# Patient Record
Sex: Male | Born: 1955 | Race: White | Hispanic: No | Smoking: Former smoker
Health system: Southern US, Community
[De-identification: ages and names within clinical notes are randomized; demographics above are authoritative.]

## PROBLEM LIST (undated history)

## (undated) DIAGNOSIS — F32A Depression, unspecified: Secondary | ICD-10-CM

## (undated) DIAGNOSIS — F429 Obsessive-compulsive disorder, unspecified: Secondary | ICD-10-CM

## (undated) DIAGNOSIS — Z5900 Homelessness unspecified: Secondary | ICD-10-CM

## (undated) DIAGNOSIS — F319 Bipolar disorder, unspecified: Secondary | ICD-10-CM

## (undated) DIAGNOSIS — R443 Hallucinations, unspecified: Secondary | ICD-10-CM

## (undated) DIAGNOSIS — M549 Dorsalgia, unspecified: Secondary | ICD-10-CM

## (undated) DIAGNOSIS — G8929 Other chronic pain: Secondary | ICD-10-CM

## (undated) DIAGNOSIS — F22 Delusional disorders: Secondary | ICD-10-CM

## (undated) DIAGNOSIS — Z9114 Patient's other noncompliance with medication regimen: Secondary | ICD-10-CM

## (undated) DIAGNOSIS — Z91148 Patient's other noncompliance with medication regimen for other reason: Secondary | ICD-10-CM

## (undated) DIAGNOSIS — K219 Gastro-esophageal reflux disease without esophagitis: Secondary | ICD-10-CM

## (undated) DIAGNOSIS — F1010 Alcohol abuse, uncomplicated: Secondary | ICD-10-CM

## (undated) DIAGNOSIS — F329 Major depressive disorder, single episode, unspecified: Secondary | ICD-10-CM

## (undated) HISTORY — DX: Depression, unspecified: F32.A

## (undated) HISTORY — PX: FRACTURE SURGERY: SHX138

## (undated) HISTORY — PX: NECK SURGERY: SHX720

---
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
## 1976-05-12 HISTORY — PX: FRACTURE SURGERY: SHX138

## 1998-05-12 HISTORY — PX: LEG SURGERY: SHX1003

## 2003-09-16 ENCOUNTER — Emergency Department (HOSPITAL_COMMUNITY): Admission: EM | Admit: 2003-09-16 | Discharge: 2003-09-16 | Payer: Self-pay | Admitting: Emergency Medicine

## 2005-04-12 ENCOUNTER — Emergency Department (HOSPITAL_COMMUNITY): Admission: EM | Admit: 2005-04-12 | Discharge: 2005-04-12 | Payer: Self-pay | Admitting: Emergency Medicine

## 2005-04-25 ENCOUNTER — Emergency Department (HOSPITAL_COMMUNITY): Admission: EM | Admit: 2005-04-25 | Discharge: 2005-04-25 | Payer: Self-pay | Admitting: Emergency Medicine

## 2010-09-13 ENCOUNTER — Emergency Department (HOSPITAL_COMMUNITY)
Admission: EM | Admit: 2010-09-13 | Discharge: 2010-09-13 | Disposition: A | Discharge status: Home / Self Care | Payer: Self-pay | Attending: Emergency Medicine | Admitting: Emergency Medicine

## 2010-09-13 DIAGNOSIS — W57XXXA Bitten or stung by nonvenomous insect and other nonvenomous arthropods, initial encounter: Secondary | ICD-10-CM | POA: Insufficient documentation

## 2010-09-13 DIAGNOSIS — S30860A Insect bite (nonvenomous) of lower back and pelvis, initial encounter: Secondary | ICD-10-CM | POA: Insufficient documentation

## 2013-02-21 ENCOUNTER — Emergency Department (HOSPITAL_COMMUNITY)
Admission: EM | Admit: 2013-02-21 | Discharge: 2013-02-21 | Disposition: A | Discharge status: Home / Self Care | Payer: Self-pay | Attending: Emergency Medicine | Admitting: Emergency Medicine

## 2013-02-21 ENCOUNTER — Encounter (HOSPITAL_COMMUNITY): Payer: Self-pay | Admitting: Emergency Medicine

## 2013-02-21 DIAGNOSIS — Z79899 Other long term (current) drug therapy: Secondary | ICD-10-CM | POA: Insufficient documentation

## 2013-02-21 DIAGNOSIS — F419 Anxiety disorder, unspecified: Secondary | ICD-10-CM

## 2013-02-21 DIAGNOSIS — F411 Generalized anxiety disorder: Secondary | ICD-10-CM | POA: Insufficient documentation

## 2013-02-21 MED ORDER — ALPRAZOLAM 0.5 MG PO TABS: 0.5000 mg | ORAL_TABLET | Freq: Three times a day (TID) | ORAL | Status: DC | PRN
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
## 2013-02-21 NOTE — Progress Notes (Deleted)
ED/CM noted patient did not have health insurance and/or PCP listed in the computer.  Patient was given the [place] resource handout with information on the clinics, food pantries, and the handout for new health insurance sign-up.  Patient expressed appreciation for this. 
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
## 2013-02-21 NOTE — ED Notes (Signed)
Explained to patient delay in care.

## 2013-03-02 ENCOUNTER — Encounter (HOSPITAL_COMMUNITY): Payer: Self-pay | Admitting: Emergency Medicine

## 2013-03-02 ENCOUNTER — Emergency Department (HOSPITAL_COMMUNITY)
Admission: EM | Admit: 2013-03-02 | Discharge: 2013-03-02 | Disposition: A | Discharge status: Other Acute Inpatient Hospital | Payer: Self-pay | Attending: Emergency Medicine | Admitting: Emergency Medicine

## 2013-03-02 ENCOUNTER — Inpatient Hospital Stay (HOSPITAL_COMMUNITY)
Admission: AD | Admit: 2013-03-02 | Discharge: 2013-03-10 | DRG: 897 | Disposition: A | Discharge status: Home / Self Care | Payer: Federal, State, Local not specified - Other | Source: Intra-hospital | Attending: Psychiatry | Admitting: Psychiatry

## 2013-03-02 DIAGNOSIS — F319 Bipolar disorder, unspecified: Secondary | ICD-10-CM | POA: Diagnosis present

## 2013-03-02 DIAGNOSIS — R4589 Other symptoms and signs involving emotional state: Secondary | ICD-10-CM

## 2013-03-02 DIAGNOSIS — F3289 Other specified depressive episodes: Secondary | ICD-10-CM | POA: Diagnosis present

## 2013-03-02 DIAGNOSIS — F10939 Alcohol use, unspecified with withdrawal, unspecified: Principal | ICD-10-CM | POA: Diagnosis present

## 2013-03-02 DIAGNOSIS — Z79899 Other long term (current) drug therapy: Secondary | ICD-10-CM

## 2013-03-02 DIAGNOSIS — F10239 Alcohol dependence with withdrawal, unspecified: Principal | ICD-10-CM | POA: Diagnosis present

## 2013-03-02 DIAGNOSIS — F1020 Alcohol dependence, uncomplicated: Secondary | ICD-10-CM | POA: Diagnosis present

## 2013-03-02 DIAGNOSIS — R4182 Altered mental status, unspecified: Secondary | ICD-10-CM | POA: Insufficient documentation

## 2013-03-02 DIAGNOSIS — F39 Unspecified mood [affective] disorder: Secondary | ICD-10-CM | POA: Insufficient documentation

## 2013-03-02 DIAGNOSIS — R45851 Suicidal ideations: Secondary | ICD-10-CM | POA: Insufficient documentation

## 2013-03-02 DIAGNOSIS — F329 Major depressive disorder, single episode, unspecified: Secondary | ICD-10-CM | POA: Diagnosis present

## 2013-03-02 LAB — CBC WITH DIFFERENTIAL/PLATELET
Basophils Absolute: 0 10*3/uL (ref 0.0–0.1)
Basophils Relative: 0 % (ref 0–1)
Eosinophils Absolute: 0.1 10*3/uL (ref 0.0–0.7)
Eosinophils Relative: 1 % (ref 0–5)
HCT: 40.4 % (ref 39.0–52.0)
Hemoglobin: 14.3 g/dL (ref 13.0–17.0)
Lymphocytes Relative: 17 % (ref 12–46)
Lymphs Abs: 1 10*3/uL (ref 0.7–4.0)
MCH: 32.6 pg (ref 26.0–34.0)
MCHC: 35.4 g/dL (ref 30.0–36.0)
MCV: 92 fL (ref 78.0–100.0)
Monocytes Absolute: 0.8 10*3/uL (ref 0.1–1.0)
Monocytes Relative: 13 % — ABNORMAL HIGH (ref 3–12)
Neutro Abs: 4.1 10*3/uL (ref 1.7–7.7)
Neutrophils Relative %: 69 % (ref 43–77)
Platelets: 296 10*3/uL (ref 150–400)
RBC: 4.39 MIL/uL (ref 4.22–5.81)
RDW: 13.4 % (ref 11.5–15.5)
WBC: 6 10*3/uL (ref 4.0–10.5)

## 2013-03-02 LAB — BASIC METABOLIC PANEL
BUN: 15 mg/dL (ref 6–23)
CO2: 30 mEq/L (ref 19–32)
Calcium: 9.6 mg/dL (ref 8.4–10.5)
Chloride: 91 mEq/L — ABNORMAL LOW (ref 96–112)
Creatinine, Ser: 0.83 mg/dL (ref 0.50–1.35)
GFR calc Af Amer: >90 mL/min (ref >90)
GFR calc non Af Amer: >90 mL/min (ref >90)
Glucose, Bld: 103 mg/dL — ABNORMAL HIGH (ref 70–99)
Potassium: 4.4 mEq/L (ref 3.5–5.1)
Sodium: 131 mEq/L — ABNORMAL LOW (ref 135–145)

## 2013-03-02 LAB — RAPID URINE DRUG SCREEN, HOSP PERFORMED
Amphetamines: NOT DETECTED
Barbiturates: NOT DETECTED
Benzodiazepines: NOT DETECTED
Cocaine: NOT DETECTED
Opiates: NOT DETECTED
Tetrahydrocannabinol: NOT DETECTED

## 2013-03-02 LAB — ETHANOL: Alcohol, Ethyl (B): <11 mg/dL (ref 0–11)

## 2013-03-02 MED ORDER — LORAZEPAM 1 MG PO TABS
1.0000 mg | ORAL_TABLET | Freq: Once | ORAL | Status: AC
  Administered 2013-03-02: 1 mg via ORAL
  Filled 2013-03-02: qty 1

## 2013-03-02 MED ORDER — LORAZEPAM 1 MG PO TABS: 1.0000 mg | ORAL_TABLET | ORAL | Status: DC | PRN

## 2013-03-02 NOTE — BH Assessment (Addendum)
After tele assessment completion writer spoke with both [facility], Thurman Coyer and PA, Donell Sievert, about patient who presents with SI, no HI, and request for detox. Patient reports no history of seizures yet blackouts have increased in number and frequency over the last 2-3 months. He reports last drink was 03/01/13 with 40 year history of alcohol uss. Patient reports he did experience 375 days of abstinence while incarcerated for felony DUI in 2008.  Writer spoke with patient's physician, Dr Estell Harpin, and patient's nurse Lurena Joiner, to inform them patient is accepted for detox to room 303/1. Patient's RN will call report to adult unit and arrange transportation via sheriff as patient is IVC.  Carney Bern, LCSW

## 2013-03-02 NOTE — ED Notes (Signed)
Pt is IVC, pt states he has been drinking in excess with blackouts, feels he is withdrawing from ETOH, pt states he denies SI/HI but petition from IVC states he commented to his brother he was depressed and didn't want to live.

## 2013-03-02 NOTE — ED Notes (Signed)
Spoke with BH.  They have about 3 ahead of Pt for consult, but will call about 10 minutes ahead of the time for his consult.

## 2013-03-02 NOTE — ED Notes (Signed)
States he has been feeling suicidal for the past couple of months, states he really does not have a plan but would probably take some pills, because they are available

## 2013-03-02 NOTE — ED Provider Notes (Signed)
CSN: [id number]     Arrival date & time 03/02/13  1521 History   First MD Initiated Contact with Patient 03/02/13 1615     Chief Complaint  Patient presents with  . V70.1   (Consider location/radiation/quality/duration/timing/severity/associated sxs/prior Treatment) Patient is a 57 y.o. male presenting with altered mental status. The history is provided by the police (pt is commited for alcohol abuse and suicidal).  Altered Mental Status Presenting symptoms: behavior changes   Severity:  Severe Most recent episode:  More than 2 days ago Episode history:  Multiple Timing:  Constant Progression:  Worsening Associated symptoms: no abdominal pain, no headaches, no rash and no seizures     History reviewed. No pertinent past medical history. Past Surgical History  Procedure Laterality Date  . Neck surgery     History reviewed. No pertinent family history. History  Substance Use Topics  . Smoking status: Never Smoker   . Smokeless tobacco: Not on file  . Alcohol Use: Yes     Comment: daily    Review of Systems  Constitutional: Negative for appetite change and fatigue.  HENT: Negative for congestion, ear discharge and sinus pressure.   Eyes: Negative for discharge.  Respiratory: Negative for cough.   Cardiovascular: Negative for chest pain.  Gastrointestinal: Negative for abdominal pain and diarrhea.  Genitourinary: Negative for frequency and hematuria.  Musculoskeletal: Negative for back pain.  Skin: Negative for rash.  Neurological: Negative for seizures and headaches.  Psychiatric/Behavioral: Positive for suicidal ideas and dysphoric mood.    Allergies  Review of patient's allergies indicates no known allergies.  Home Medications  No current outpatient prescriptions on file. BP 131/99  Pulse 82  Temp(Src) 98.3 F (36.8 C) (Oral)  Resp 20  Ht 5\' 8"  (1.727 m)  Wt 135 lb (61.236 kg)  BMI 20.53 kg/m2  SpO2 98% Physical Exam  Constitutional: He is oriented to  person, place, and time. He appears well-developed.  HENT:  Head: Normocephalic.  Eyes: Conjunctivae and EOM are normal. No scleral icterus.  Neck: Neck supple. No thyromegaly present.  Cardiovascular: Normal rate and regular rhythm.  Exam reveals no gallop and no friction rub.   No murmur heard. Pulmonary/Chest: No stridor. He has no wheezes. He has no rales. He exhibits no tenderness.  Abdominal: He exhibits no distension. There is no tenderness. There is no rebound.  Musculoskeletal: Normal range of motion. He exhibits no edema.  Lymphadenopathy:    He has no cervical adenopathy.  Neurological: He is oriented to person, place, and time. He exhibits normal muscle tone. Coordination normal.  Skin: No rash noted. No erythema.  Psychiatric:  Pt is depressed.  Denies suicidal now.  Was suicidal early today    ED Course  Procedures (including critical care time) Labs Review Labs Reviewed  CBC WITH DIFFERENTIAL - Abnormal; Notable for the following:    Monocytes Relative 13 (*)    All other components within normal limits  BASIC METABOLIC PANEL - Abnormal; Notable for the following:    Sodium 131 (*)    Chloride 91 (*)    Glucose, Bld 103 (*)    All other components within normal limits  ETHANOL  URINE RAPID DRUG SCREEN (HOSP PERFORMED)   Imaging Review No results found.  EKG Interpretation   None       MDM  No diagnosis found.     Benny Lennert, MD 03/02/13 [phone]

## 2013-03-02 NOTE — ED Notes (Signed)
Security returned belonging to pt. Clothes in bag, given to pt. Officer leaving with pt. Report called, paper work complete and copied

## 2013-03-02 NOTE — ED Notes (Signed)
telepsych in process. Officer at the door.

## 2013-03-02 NOTE — ED Notes (Signed)
Feels like something is crawling over his body

## 2013-03-02 NOTE — BH Assessment (Signed)
Assessment Note  [name] is an 57 y.o. male.   Patient presented to [facility] ED today with brother who has since taken IVC papers on patient due to patient's reported suicidal ideation. Patient has plan to overdose on ibuprofen or hydro condone along with alcohol.  Writer spoke with patient via tele assessment and patient in agreement of increasing suicidal thoughts over last few days/week as feelings of hopelessness and despair increased in relation to increased alcohol use and increasing financial concerns. Patient reports he lives alone and that home is secure yet he has had increased financial stress concerning rental property due to non payment of rent and that property is now in foreclosure. Patient reports increased stress with family in relation to their concern about his daily alcohol use. 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    Patient reports no history of seizures yet blackouts have increased in number and frequency over the last 2-3 months. He reports last drink was 03/01/13 with 40 year history of alcohol use. Patient reports he did experience 375 days of abstinence while incarcerated for felony DUI in 2008.  Writer spoke with patient's physician, Dr Estell Harpin, and patient's nurse Lurena Joiner, to inform them patient is accepted for detox to room 303/1. Patient's RN will call report to adult unit and arrange transportation via sheriff as patient is IVC.   Axis I: Alcohol Abuse and Suicidal Ideation Axis II: Deferred Axis III: History reviewed. No pertinent past medical history. Axis IV: economic problems, other psychosocial or environmental problems, problems related to social environment, problems with access to health care services and problems with primary support group Axis V: 11-20 some danger of hurting self or others possible OR  occasionally fails to maintain minimal personal hygiene OR gross impairment in communication  Past Medical History: History reviewed. No pertinent past medical history.  Past Surgical History  Procedure Laterality Date  . Neck surgery      Family History: History reviewed. No pertinent family history.  Social History:  reports that he has never smoked. He does not have any smokeless tobacco history on file. He reports that he drinks alcohol. He reports that he does not use illicit drugs.  Additional Social History:     CIWA: CIWA-Ar BP: 123/63 mmHg Pulse Rate: 69 Nausea and Vomiting: no nausea and no vomiting Tactile Disturbances: moderately severe hallucinations Tremor: no tremor Auditory Disturbances: not present Paroxysmal Sweats: barely perceptible sweating, palms moist Visual Disturbances: not present Anxiety: no anxiety, at ease Headache, Fullness in Head: none present Agitation: normal activity Orientation and Clouding of Sensorium: oriented and can do serial additions CIWA-Ar Total: 5 COWS:    Allergies: No Known Allergies  Home Medications:  (Not in a hospital admission)  OB/GYN Status:  No LMP for male patient.  General Assessment Data Location of Assessment: [facility] ACT Assessment: Yes Is this a Tele or Face-to-Face Assessment?: Tele Assessment Is this an Initial Assessment or a Re-assessment for this encounter?: Initial Assessment Living Arrangements: Alone Can pt return to current living arrangement?: Yes Admission Status: Involuntary Is patient capable of signing voluntary admission?: Yes Transfer from: Acute Hospital Referral Source: Self/Family/Friend  Medical Screening Exam [facility] Walk-in ONLY) Medical Exam completed: Yes  [facility] Crisis Care Plan Living Arrangements: Alone Name of Psychiatrist: NA Name of Therapist: NA  Education Status Is patient currently  in school?: No Current Grade: NA Highest grade of school patient has completed: 12  Risk  to self Suicidal Ideation: Yes-Currently Present Suicidal Intent: Yes-Currently Present Is patient at risk for suicide?: Yes Suicidal Plan?: Yes-Currently Present Specify Current Suicidal Plan: Overdose on pills Access to Means: Yes Specify Access to Suicidal Means: Access to ibuprofen and hydrocodone in addition to alcohol What has been your use of drugs/alcohol within the last 12 months?: Daily Alcohol Use Previous Attempts/Gestures: No How many times?: 0 Other Self Harm Risks: Daily use of alcohol Triggers for Past Attempts: Other (Comment) (No previous attempts) Intentional Self Injurious Behavior: None Family Suicide History: Unknown Recent stressful life event(s): Financial Problems (Patient reports his rental property is in foreclosure & his work is slowing down due to seasonal nature of his work) Persecutory voices/beliefs?: Yes Depression: Yes Depression Symptoms: Isolating;Feeling angry/irritable;Loss of interest in usual pleasures;Feeling worthless/self pity;Guilt;Fatigue;Insomnia Substance abuse history and/or treatment for substance abuse?: Yes Suicide prevention information given to non-admitted patients: Not applicable  Risk to Others Homicidal Ideation: No Thoughts of Harm to Others: No Current Homicidal Intent: No Current Homicidal Plan: No Access to Homicidal Means: No Identified Victim: NA History of harm to others?: No Assessment of Violence: None Noted Violent Behavior Description: NA Does patient have access to weapons?: No Criminal Charges Pending?: No Does patient have a court date: No  Psychosis Hallucinations: Auditory;Visual. Patient reports increased auditory and visual hallucinations. Believes theses are related to increased alcohol use and decreased sleep.  Delusions: None noted  Mental Status Report Appear/Hygiene: Other (Comment) (Patient in scrubs, difficult to assess) Eye Contact: Good Motor Activity: Restlessness Speech:  Logical/coherent Level of Consciousness: Alert Mood: Depressed;Despair;Worthless, low self-esteem Affect: Appropriate to circumstance;Depressed Anxiety Level: Moderate Thought Processes: Coherent Judgement: Unimpaired Orientation: Person;Place;Time;Situation;Appropriate for developmental age Obsessive Compulsive Thoughts/Behaviors: None  Cognitive Functioning Concentration: Normal Memory: Remote Intact;Recent Impaired (As per self report) IQ: Average Insight: Fair Impulse Control: Fair Appetite: Poor Weight Loss: 20 Weight Gain: 0 Sleep: Decreased Total Hours of Sleep: 3 Vegetative Symptoms: Decreased grooming  ADLScreening [facility]) Patient's cognitive ability adequate to safely complete daily activities?: Yes Patient able to express need for assistance with ADLs?: Yes Independently performs ADLs?: Yes (appropriate for developmental age)  Prior Inpatient Therapy Prior Inpatient Therapy: No Prior Therapy Dates: NA Prior Therapy Facility/Provider(s): NA  Prior Outpatient Therapy Prior Outpatient Therapy: No Prior Therapy Dates: NA Prior Therapy Facilty/Provider(s): NA  ADL Screening (condition at time of admission) Patient's cognitive ability adequate to safely complete daily activities?: Yes Patient able to express need for assistance with ADLs?: Yes Independently performs ADLs?: Yes (appropriate for developmental age)   Abuse/Neglect Assessment (Assessment to be complete while patient is alone) Physical Abuse: Denies Verbal Abuse: Denies Sexual Abuse: Denies Exploitation of patient/patient's resources: Denies Self-Neglect: Denies Values / Beliefs Cultural Requests During Hospitalization: None Spiritual Requests During Hospitalization: None   Advance Directives (For Healthcare) Advance Directive: Patient does not have advance directive Pre-existing out of facility DNR order (yellow form or pink MOST form): No    Additional Information 1:1 In  Past 12 Months?: No CIRT Risk: No Elopement Risk: No Does patient have medical clearance?: Yes   Disposition:  Disposition Initial Assessment Completed for this Encounter: Yes Disposition of Patient: Inpatient treatment program Type of inpatient treatment program: Adult  On Site Evaluation by:   Reviewed with Physician:    Clide Dales 03/02/2013 8:51 PM

## 2013-03-02 NOTE — ED Notes (Signed)
Pt denies si/ hi, wants detox from alcohol.

## 2013-03-03 ENCOUNTER — Encounter (HOSPITAL_COMMUNITY): Payer: Self-pay | Admitting: *Deleted

## 2013-03-03 DIAGNOSIS — F329 Major depressive disorder, single episode, unspecified: Secondary | ICD-10-CM | POA: Diagnosis present

## 2013-03-03 DIAGNOSIS — F1994 Other psychoactive substance use, unspecified with psychoactive substance-induced mood disorder: Secondary | ICD-10-CM

## 2013-03-03 DIAGNOSIS — F10239 Alcohol dependence with withdrawal, unspecified: Principal | ICD-10-CM | POA: Diagnosis present

## 2013-03-03 DIAGNOSIS — F3289 Other specified depressive episodes: Secondary | ICD-10-CM | POA: Diagnosis present

## 2013-03-03 DIAGNOSIS — F321 Major depressive disorder, single episode, moderate: Secondary | ICD-10-CM

## 2013-03-03 DIAGNOSIS — F1020 Alcohol dependence, uncomplicated: Secondary | ICD-10-CM | POA: Diagnosis present

## 2013-03-03 LAB — COMPREHENSIVE METABOLIC PANEL
Albumin: 3.5 g/dL (ref 3.5–5.2)
Alkaline Phosphatase: 101 U/L (ref 39–117)
BUN: 19 mg/dL (ref 6–23)
CO2: 27 mEq/L (ref 19–32)
Chloride: 92 mEq/L — ABNORMAL LOW (ref 96–112)
Creatinine, Ser: 0.88 mg/dL (ref 0.50–1.35)
GFR calc Af Amer: >90 mL/min (ref >90)
GFR calc non Af Amer: >90 mL/min (ref >90)
Glucose, Bld: 92 mg/dL (ref 70–99)
Potassium: 4.2 mEq/L (ref 3.5–5.1)
Total Bilirubin: 0.3 mg/dL (ref 0.3–1.2)

## 2013-03-03 MED ORDER — THIAMINE HCL 100 MG/ML IJ SOLN: 100.0000 mg | Freq: Once | INTRAMUSCULAR | Status: DC

## 2013-03-03 MED ORDER — MAGNESIUM HYDROXIDE 400 MG/5ML PO SUSP: 30.0000 mL | Freq: Every day | ORAL | Status: DC | PRN

## 2013-03-03 MED ORDER — CHLORDIAZEPOXIDE HCL 25 MG PO CAPS: 25.0000 mg | ORAL_CAPSULE | Freq: Four times a day (QID) | ORAL | Status: AC | PRN

## 2013-03-03 MED ORDER — CHLORDIAZEPOXIDE HCL 25 MG PO CAPS
25.0000 mg | ORAL_CAPSULE | Freq: Every day | ORAL | Status: AC
  Administered 2013-03-06: 25 mg via ORAL
  Filled 2013-03-03: qty 1

## 2013-03-03 MED ORDER — CHLORDIAZEPOXIDE HCL 25 MG PO CAPS
25.0000 mg | ORAL_CAPSULE | Freq: Three times a day (TID) | ORAL | Status: AC
  Administered 2013-03-04 (×2): 25 mg via ORAL
  Filled 2013-03-03 (×3): qty 1

## 2013-03-03 MED ORDER — ENSURE COMPLETE PO LIQD
237.0000 mL | Freq: Two times a day (BID) | ORAL | Status: DC
  Administered 2013-03-03 – 2013-03-09 (×11): 237 mL via ORAL

## 2013-03-03 MED ORDER — LOPERAMIDE HCL 2 MG PO CAPS: 2.0000 mg | ORAL_CAPSULE | ORAL | Status: AC | PRN

## 2013-03-03 MED ORDER — TRAZODONE HCL 100 MG PO TABS
100.0000 mg | ORAL_TABLET | Freq: Every evening | ORAL | Status: DC | PRN
  Administered 2013-03-03 – 2013-03-08 (×4): 100 mg via ORAL
  Filled 2013-03-03 (×16): qty 1

## 2013-03-03 MED ORDER — ONDANSETRON 4 MG PO TBDP: 4.0000 mg | ORAL_TABLET | Freq: Four times a day (QID) | ORAL | Status: AC | PRN

## 2013-03-03 MED ORDER — HYDROXYZINE HCL 25 MG PO TABS
25.0000 mg | ORAL_TABLET | Freq: Four times a day (QID) | ORAL | Status: AC | PRN
  Administered 2013-03-03 – 2013-03-04 (×2): 25 mg via ORAL
  Filled 2013-03-03 (×2): qty 1

## 2013-03-03 MED ORDER — VITAMIN B-1 100 MG PO TABS
100.0000 mg | ORAL_TABLET | Freq: Every day | ORAL | Status: DC
  Administered 2013-03-04 – 2013-03-10 (×7): 100 mg via ORAL
  Filled 2013-03-03 (×8): qty 1

## 2013-03-03 MED ORDER — CHLORDIAZEPOXIDE HCL 25 MG PO CAPS
25.0000 mg | ORAL_CAPSULE | ORAL | Status: AC
  Administered 2013-03-05 (×2): 25 mg via ORAL
  Filled 2013-03-03: qty 1

## 2013-03-03 MED ORDER — CHLORDIAZEPOXIDE HCL 25 MG PO CAPS
25.0000 mg | ORAL_CAPSULE | Freq: Four times a day (QID) | ORAL | Status: AC
  Administered 2013-03-03 (×4): 25 mg via ORAL
  Filled 2013-03-03 (×4): qty 1

## 2013-03-03 MED ORDER — ADULT MULTIVITAMIN W/MINERALS CH
1.0000 | ORAL_TABLET | Freq: Every day | ORAL | Status: DC
  Administered 2013-03-03 – 2013-03-10 (×8): 1 via ORAL
  Filled 2013-03-03 (×9): qty 1

## 2013-03-03 MED ORDER — TRAZODONE HCL 50 MG PO TABS
50.0000 mg | ORAL_TABLET | Freq: Every evening | ORAL | Status: DC | PRN
  Administered 2013-03-03: 50 mg via ORAL
  Filled 2013-03-03 (×3): qty 1

## 2013-03-03 MED ORDER — ALUM & MAG HYDROXIDE-SIMETH 200-200-20 MG/5ML PO SUSP: 30.0000 mL | ORAL | Status: DC | PRN

## 2013-03-03 MED ORDER — ACETAMINOPHEN 325 MG PO TABS
650.0000 mg | ORAL_TABLET | Freq: Four times a day (QID) | ORAL | Status: DC | PRN
  Administered 2013-03-06 – 2013-03-10 (×5): 650 mg via ORAL
  Filled 2013-03-03 (×5): qty 2

## 2013-03-03 NOTE — [facility] Suicide Risk Assessment (Addendum)
[facility] INPATIENT:  Family/Significant Other Suicide Prevention Education  Suicide Prevention Education:  Contact Attempts: Kiwan Gadsden (pt's brother) [phone] has been identified by the patient as the family member/significant other with whom the patient will be residing, and identified as the person(s) who will aid the patient in the event of a mental health crisis.  With written consent from the patient, two attempts were made to provide suicide prevention education, prior to and/or following the patient's discharge.  We were unsuccessful in providing suicide prevention education.  A suicide education pamphlet was given to the patient to share with family/significant other.  Date and time of first attempt: 03/03/13 10:55AM  Date and time of second attempt: 03/03/13 3:32PM (voicemail left)   SPE completed with brother today at 12:15PM  Smart, Margery Szostak 03/03/2013, 10:59 AM

## 2013-03-03 NOTE — Progress Notes (Signed)
Patient did attend the evening karaoke group.  Pt displayed very manic behavior during group. Pt was very engaged in group but was loud and intrusive. Pt was redirectable but behavior returned. It was difficult for pt to sit and repeatedly got out of seat to converse with peer pts. Pt did enjoy karaoke and was very excited during group.

## 2013-03-03 NOTE — Progress Notes (Signed)
Nutrition Brief Note  Pt meets criteria for severe MALNUTRITION in the context of social/environmental circumstances as evidenced by <50% estimated energy intake with 20.6% weight loss in the past 2 months per pt report.  Patient identified on the Malnutrition Screening Tool (MST) Report.  Wt Readings from Last 10 Encounters:  03/02/13 123 lb (55.792 kg)  03/02/13 135 lb (61.236 kg)  02/21/13 140 lb (63.504 kg)   Body mass index is 19.56 kg/(m^2). Patient meets criteria for normal weight based on current BMI.   Discussed intake PTA with patient and compared to intake presently.  Discussed changes in intake and encouraged adequate intake of meals and snacks. Met with pt who reports weighing 155 pounds 2 months ago, now weighs 123 pounds. States this is because he was doing a lot of drinking and not a lot of eating. States sometimes he wouldn't eat anything all day and the next day he would only eat 1 meal/day, typically a can of beans or a [place] sandwich. C/o feeling weak PTA r/t not eating. Reports great appetite during admission. Interested in getting Ensure Complete BID to improve his energy.   Current diet order is regular and pt is also offered choice of unit snacks mid-morning and mid-afternoon.  Pt is eating as desired.   Labs and medications reviewed. Sodium low. Getting daily multivitamin and thiamine.   Nutrition Dx:  Unintended wt change r/t suboptimal oral intake AEB pt report  Interventions:   Discussed the importance of nutrition and encouraged intake of food and beverages.     Discussed weight goals with patient.   Supplements: Ensure Complete BID  No additional nutrition interventions warranted at this time. If nutrition issues arise, please consult RD.   Levon Hedger MS, RD, LDN [phone] Pager [phone] After Hours Pager

## 2013-03-03 NOTE — [facility] Group Notes (Signed)
[facility] LCSW Group Therapy  03/03/2013 3:43 PM  Type of Therapy:  Group Therapy  Participation Level:  Active  Participation Quality:  Attentive  Affect:  Excited  Cognitive:  Alert and Oriented  Insight:  Improving  Engagement in Therapy:  Improving  Modes of Intervention:  Confrontation, Discussion, Education, Exploration, Socialization and Support  Summary of Progress/Problems:  Finding Balance in Life. Today's group focused on defining balance in one's own words, identifying things that can knock one off balance, and exploring healthy ways to maintain balance in life. Group members were asked to provide an example of a time when they felt off balance, describe how they handled that situation,and process healthier ways to regain balance in the future. Group members were asked to share the most important tool for maintaining balance that they learned while at [facility] and how they plan to apply this method after discharge. [name] was attentive and engaged throughout today's therapy group. [name] stated that to him, balance meant "having everything in line and having support from family and friends." [name] shows progress in the group setting and improving insight AEB his ability to actively participate in group discussion, identify triggers that lead to life imbalance, and identify ways that he can reestablish a sense of balance in healthier ways. [name] identified "dealing with my employees that are lazy, financial problems, and loneliness" as things that throw him off balance. [name] stated that he needs to learn how to accept new ideas and ways of thinking in order to try something new in leu of drinking to deal with stress and imbalance. "I self medicate with alcohol but I feel that returning to the church and getting God and church friends back in my life will help me cope with daily stress. 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

## 2013-03-03 NOTE — Tx Team (Signed)
Initial Interdisciplinary Treatment Plan  PATIENT STRENGTHS: (choose at least two) Ability for insight Active sense of humor Average or above average intelligence Capable of independent living General fund of knowledge Motivation for treatment/growth Supportive family/friends Work skills  PATIENT STRESSORS: Paediatric nurse issue Substance abuse   PROBLEM LIST: Problem List/Patient Goals Date to be addressed Date deferred Reason deferred Estimated date of resolution  ETOH abuse      Financial concerns      Risk for self harm      "I need to stop drinking; my family is very supportive."                                     DISCHARGE CRITERIA:  Improved stabilization in mood, thinking, and/or behavior Motivation to continue treatment in a less acute level of care Verbal commitment to aftercare and medication compliance Withdrawal symptoms are absent or subacute and managed without 24-hour nursing intervention  PRELIMINARY DISCHARGE PLAN: Attend 12-step recovery group Outpatient therapy Return to previous living arrangement Return to previous work or school arrangements  PATIENT/FAMIILY INVOLVEMENT: This treatment plan has been presented to and reviewed with the patient, [name], and/or family member.  The patient and family have been given the opportunity to ask questions and make suggestions.  [facility] 03/03/2013, 1:19 AM

## 2013-03-03 NOTE — BHH Suicide Risk Assessment (Signed)
Suicide Risk Assessment  Admission Assessment     Nursing information obtained from:  Patient Demographic factors:  Male;Divorced or widowed;Caucasian;Living alone Current Mental Status:  NA Loss Factors:  Legal issues;Financial problems / change in socioeconomic status Historical Factors:  NA Risk Reduction Factors:  Sense of responsibility to family;Religious beliefs about death;Employed;Positive social support  CLINICAL FACTORS:   Depression:   Comorbid alcohol abuse/dependence Alcohol/Substance Abuse/Dependencies  COGNITIVE FEATURES THAT CONTRIBUTE TO RISK:  Polarized thinking Thought constriction (tunnel vision)    SUICIDE RISK:   Moderate:  Frequent suicidal ideation with limited intensity, and duration, some specificity in terms of plans, no associated intent, good self-control, limited dysphoria/symptomatology, some risk factors present, and identifiable protective factors, including available and accessible social support.  PLAN OF CARE: Supportive approach/coping skills/relapse prevention                              Assess and address the co morbidities  I certify that inpatient services furnished can reasonably be expected to improve the patient's condition.  Keyetta Hollingworth A 03/03/2013, 5:15 PM
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
## 2013-03-03 NOTE — Progress Notes (Signed)
Patient ID: [name], male   DOB: 1955/08/20, 57 y.o.   MRN: [id number] Pt did not attend Nursing Group with Lupita Leash at 9:30am. He was encouraged to attend but chose to stay in bed.
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
## 2013-03-03 NOTE — Progress Notes (Signed)
D: Pt. Sitting in room reading magazines and other material. When getting 0800 medication pt. Stated that while sleeping he felt as though bugs were crawling on him, but when writer reoriented his statement at current time, he restated that he felt them the previous day and not today. Pt. Is in an upbeat mood, stating that when he leaves he is going to start going to church and become very involved. Pt. Stated he does not need to go to AA. Pt. Stated that praying about not drinking alcohol is all he needs. Pt stated he feels as though God has taking is want to drink away. " All I need is my church family and to stay close with God. I know what my problem is, it's drinking, and I don't need to go to AA to tell me that. It's so depressing." Denies SI/HI/AVH. Pt. Likes to be called Josie.  A: Q 15 min. Safety checks. Support and encouragement offered. Scheduled and prn medication offered. R:Pt. Remains safe on the unit. Appropriately interacting with staff and others.

## 2013-03-03 NOTE — Progress Notes (Signed)
Invol admit presents for detox from alcohol.  Pt says he also smokes marijuana occasionally.  Pt states he drinks daily and most days to the point of passing out.  Pt says is appetite and sleep have been poor.  Pt says he is having financial difficulties and he has a rental house in foreclosure.  His family is supportive, but there is stress in the relationship because of his drinking.  He says sometimes he is not able to work because of his drinking.  Pt has a hx of jail time in 2008 for a DUI.  Pt denies any major medical issues, but reports he has seasonal allergies.  Pt was cooperative with the admission process.  Meal provided.  Pt was oriented to unit room.  Safety checks initiated q15 minutes.

## 2013-03-04 DIAGNOSIS — F3110 Bipolar disorder, current episode manic without psychotic features, unspecified: Secondary | ICD-10-CM

## 2013-03-04 MED ORDER — QUETIAPINE FUMARATE 100 MG PO TABS
100.0000 mg | ORAL_TABLET | Freq: Once | ORAL | Status: AC
  Administered 2013-03-04: 100 mg via ORAL
  Filled 2013-03-04: qty 1

## 2013-03-04 MED ORDER — QUETIAPINE FUMARATE 100 MG PO TABS
100.0000 mg | ORAL_TABLET | Freq: Two times a day (BID) | ORAL | Status: DC
  Administered 2013-03-04: 100 mg via ORAL
  Filled 2013-03-04 (×5): qty 1

## 2013-03-04 MED ORDER — LORAZEPAM 1 MG PO TABS
1.0000 mg | ORAL_TABLET | Freq: Four times a day (QID) | ORAL | Status: DC | PRN
  Administered 2013-03-05: 1 mg via ORAL
  Filled 2013-03-04: qty 1

## 2013-03-04 MED ORDER — QUETIAPINE FUMARATE 100 MG PO TABS
ORAL_TABLET | ORAL | Status: AC
  Filled 2013-03-04: qty 1

## 2013-03-04 MED ORDER — ZIPRASIDONE MESYLATE 20 MG IM SOLR
20.0000 mg | Freq: Once | INTRAMUSCULAR | Status: AC
  Administered 2013-03-04: 20 mg via INTRAMUSCULAR
  Filled 2013-03-04: qty 20

## 2013-03-04 MED ORDER — QUETIAPINE FUMARATE 100 MG PO TABS
100.0000 mg | ORAL_TABLET | Freq: Every day | ORAL | Status: DC
  Filled 2013-03-04 (×2): qty 1

## 2013-03-04 MED ORDER — ZIPRASIDONE HCL 80 MG PO CAPS
80.0000 mg | ORAL_CAPSULE | Freq: Two times a day (BID) | ORAL | Status: DC
  Administered 2013-03-05 – 2013-03-07 (×6): 80 mg via ORAL
  Filled 2013-03-04 (×10): qty 1

## 2013-03-04 MED ORDER — ZIPRASIDONE MESYLATE 20 MG IM SOLR
INTRAMUSCULAR | Status: AC
  Filled 2013-03-04: qty 20

## 2013-03-04 MED ORDER — DIVALPROEX SODIUM ER 500 MG PO TB24
500.0000 mg | ORAL_TABLET | Freq: Two times a day (BID) | ORAL | Status: DC
  Administered 2013-03-04 – 2013-03-10 (×12): 500 mg via ORAL
  Filled 2013-03-04 (×15): qty 1

## 2013-03-04 NOTE — BHH Suicide Risk Assessment (Signed)
Suicide Risk Assessment  Discharge Assessment     Demographic Factors:  Male and Caucasian  Mental Status Per Nursing Assessment::   On Admission:  NA  Current Mental Status by Physician: In full contact with reality. There are no suicidal ideas, plans or intent. There is not acute withdrawal. He is worried as has work waiting for him and needs the money to make payments so he does not lose his house. He states he is committed to abstain from using alcohol. He is focused on getting to work saving his house and sees drinking getting in the way of his goal   Loss Factors: Financial problems/change in socioeconomic status  Historical Factors: NA  Risk Reduction Factors:   Employed and wanting to do better, positive goals for himself  Continued Clinical Symptoms:  Alcohol/Substance Abuse/Dependencies  Cognitive Features That Contribute To Risk:  Polarized thinking Thought constriction (tunnel vision)    Suicide Risk:  Minimal: No identifiable suicidal ideation.  Patients presenting with no risk factors but with morbid ruminations; may be classified as minimal risk based on the severity of the depressive symptoms  Discharge Diagnoses:   AXIS I:  Alcohol dependence, withdrawal, substance induced mood disorder AXIS II:  Deferred AXIS III:  History reviewed. No pertinent past medical history. AXIS IV:  economic problems AXIS V:  61-70 mild symptoms  Plan Of Care/Follow-up recommendations:  Activity:  as tolerated Diet:  regular Follow up outpatient basis/AA Is patient on multiple antipsychotic therapies at discharge:  No   Has Patient had three or more failed trials of antipsychotic monotherapy by history:  No  Recommended Plan for Multiple Antipsychotic Therapies: NA  [name] A 03/04/2013, 11:39 AM
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
## 2013-03-04 NOTE — [facility] Group Notes (Signed)
[facility] LCSW Group Therapy  03/04/2013 3:43 PM  Type of Therapy:  Group Therapy  Participation Level:  Did Not Attend  Patient became increasingly bizarre in affect and mood. He became irritable and angry and threw a chair.  Patient was unable to come to group as he was also receiving medication. He moved from [address].  DC was cancelled.    Nail, Catalina Gravel 03/04/2013, 3:43 PM

## 2013-03-04 NOTE — Progress Notes (Signed)
D   Pt reports getting a cold after sleeping in his room last night with the cold air blowing on him   He said he tried to change it but was unable to do so but said he reported to the nurse   He appears sad and anxious and a little irritable and argumentative when talking about his medications   He is wearing a cloth tied around his head   He said the reason why is because he has a head cold  Pt attends and participates in groups  And interacts well with others A   Verbal support given   Medications administered and effectiveness monitored   Q 15 min checks  R   Pt safe at present
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
## 2013-03-04 NOTE — Progress Notes (Signed)
[facility] MD Progress Note  03/04/2013 1:45 PM [name]  MRN:  [id number] Subjective:  As the morning progressed [name] became more agitated. He had painted his face, a cross in his forehead (stated he was christian) dark paint upper cheek area, and finally green in his neck grateful that he is sober. He has been worried about the other patients wanting to be there and help them. We were able to communicate with his brother who stated that he just went trough detox that he did not follow trough and that he has been acting strange.  Diagnosis:   DSM5: Schizophrenia Disorders:   Obsessive-Compulsive Disorders:   Trauma-Stressor Disorders:   Substance/Addictive Disorders:  Alcohol Related Disorder - Severe (303.90) Depressive Disorders:    Axis I: Bipolar, Manic and Mood Disorder NOS R/O  ADL's:  Intact  Sleep: Fair  Appetite:  Fair  Suicidal Ideation:  Plan:  denies Intent:  denies Means:  denies Homicidal Ideation:  Plan:  denies Intent:  denies Means:  denies AEB (as evidenced by):  Psychiatric Specialty Exam: Review of Systems  Constitutional: Negative.   HENT: Negative.   Eyes: Negative.   Respiratory: Negative.   Cardiovascular: Negative.   Gastrointestinal: Negative.   Genitourinary: Negative.   Musculoskeletal: Negative.   Skin: Negative.   Neurological: Negative.   Endo/Heme/Allergies: Negative.   Psychiatric/Behavioral: Positive for substance abuse. The patient is nervous/anxious.     Blood pressure 118/85, pulse 82, temperature 97.6 F (36.4 C), temperature source Oral, resp. rate 18, height 5' 6.5" (1.689 m), weight 55.792 kg (123 lb).Body mass index is 19.56 kg/(m^2).  General Appearance: Disheveled and bandana, patins in face  Eye Contact::  Fair  Speech:  Clear and Coherent and rapid  Volume:  fluctuates  Mood:  Anxious and agitated  Affect:  agitated, labile  Thought Process:  worries, concerns  Orientation:  Full (Time, Place, and Person)  Thought  Content:  underlying suspiciousness  Suicidal Thoughts:  No  Homicidal Thoughts:  No  Memory:  Immediate;   Fair Recent;   Fair Remote;   Fair  Judgement:  Fair  Insight:  Lacking  Psychomotor Activity:  Restlessness and agitated  Concentration:  Poor  Recall:  Poor  Akathisia:  No  Handed:    AIMS (if indicated):     Assets:  Desire for Improvement Housing  Sleep:  Number of Hours: 5   Current Medications: Current Facility-Administered Medications  Medication Dose Route Frequency Provider Last Rate Last Dose  . acetaminophen (TYLENOL) tablet 650 mg  650 mg Oral Q6H PRN Kerry Hough, PA-C      . alum & mag hydroxide-simeth (MAALOX/MYLANTA) 200-200-20 MG/5ML suspension 30 mL  30 mL Oral Q4H PRN Kerry Hough, PA-C      . chlordiazePOXIDE (LIBRIUM) capsule 25 mg  25 mg Oral Q6H PRN Kerry Hough, PA-C      . chlordiazePOXIDE (LIBRIUM) capsule 25 mg  25 mg Oral TID Kerry Hough, PA-C   25 mg at 03/04/13 1109   Followed by  . [START ON 03/05/2013] chlordiazePOXIDE (LIBRIUM) capsule 25 mg  25 mg Oral BH-qamhs Spencer E Simon, PA-C       Followed by  . [START ON 03/06/2013] chlordiazePOXIDE (LIBRIUM) capsule 25 mg  25 mg Oral Daily Spencer E Simon, PA-C      . feeding supplement (ENSURE COMPLETE) (ENSURE COMPLETE) liquid 237 mL  237 mL Oral BID BM Lavena Bullion, RD   237 mL at 03/03/13 1438  .  hydrOXYzine (ATARAX/VISTARIL) tablet 25 mg  25 mg Oral Q6H PRN Kerry Hough, PA-C   25 mg at 03/04/13 1107  . loperamide (IMODIUM) capsule 2-4 mg  2-4 mg Oral PRN Kerry Hough, PA-C      . magnesium hydroxide (MILK OF MAGNESIA) suspension 30 mL  30 mL Oral Daily PRN Kerry Hough, PA-C      . multivitamin with minerals tablet 1 tablet  1 tablet Oral Daily Kerry Hough, PA-C   1 tablet at 03/04/13 0856  . ondansetron (ZOFRAN-ODT) disintegrating tablet 4 mg  4 mg Oral Q6H PRN Kerry Hough, PA-C      . QUEtiapine (SEROQUEL) 100 MG tablet           . thiamine (B-1) injection  100 mg  100 mg Intramuscular Once [employer], PA-C      . thiamine (VITAMIN B-1) tablet 100 mg  100 mg Oral Daily Kerry Hough, PA-C   100 mg at 03/04/13 0855  . traZODone (DESYREL) tablet 100 mg  100 mg Oral QHS,MR X 1 Rachael Fee, MD   100 mg at 03/03/13 2143    Lab Results:  Results for orders placed during the hospital encounter of 03/02/13 (from the past 48 hour(s))  COMPREHENSIVE METABOLIC PANEL     Status: Abnormal   Collection Time    03/03/13  7:55 PM      Result Value Range   Sodium 129 (*) 135 - 145 mEq/L   Potassium 4.2  3.5 - 5.1 mEq/L   Chloride 92 (*) 96 - 112 mEq/L   CO2 27  19 - 32 mEq/L   Glucose, Bld 92  70 - 99 mg/dL   BUN 19  6 - 23 mg/dL   Creatinine, Ser 1.61  0.50 - 1.35 mg/dL   Calcium 9.4  8.4 - 09.6 mg/dL   Total Protein 7.2  6.0 - 8.3 g/dL   Albumin 3.5  3.5 - 5.2 g/dL   AST 67 (*) 0 - 37 U/L   ALT 42  0 - 53 U/L   Alkaline Phosphatase 101  39 - 117 U/L   Total Bilirubin 0.3  0.3 - 1.2 mg/dL   GFR calc non Af Amer >90  >90 mL/min   GFR calc Af Amer >90  >90 mL/min   Comment: (NOTE)     The eGFR has been calculated using the CKD EPI equation.     This calculation has not been validated in all clinical situations.     eGFR's persistently <90 mL/min signify possible Chronic Kidney     Disease.     Performed at [facility]    Physical Findings: AIMS: Facial and Oral Movements Muscles of Facial Expression: None, normal Lips and Perioral Area: None, normal Jaw: None, normal Tongue: None, normal,Extremity Movements Upper (arms, wrists, hands, fingers): None, normal Lower (legs, knees, ankles, toes): None, normal, Trunk Movements Neck, shoulders, hips: None, normal, Overall Severity Severity of abnormal movements (highest score from questions above): None, normal Incapacitation due to abnormal movements: None, normal Patient's awareness of abnormal movements (rate only patient's report): No Awareness, Dental  Status Current problems with teeth and/or dentures?: No Does patient usually wear dentures?: No  CIWA:  CIWA-Ar Total: 3 COWS:     Treatment Plan Summary: Daily contact with patient to assess and evaluate symptoms and progress in treatment Medication management  Plan: Supportive approach/coping skills/relapse prevention  Complete the detox           Address the mood disorder R/O Bipolar Disorder           Seroquel/Depakote Medical Decision Making Problem Points:  Review of psycho-social stressors (1) Data Points:  Review of medication regiment & side effects (2)  I certify that inpatient services furnished can reasonably be expected to improve the patient's condition.   Ghassan Coggeshall A 03/04/2013, 1:45 PM

## 2013-03-04 NOTE — Clinical Social Work Note (Signed)
CSW contacted pt's brother who expressed concern regarding pt's d/c today. Pt's brother stated that [place] recently went through detox, rushed his treatment and quickly relapsed/did not attend follow-up appts and demonstrated dangerous/bizarre behaviors that led to trespassing charges made by pt's neighbors. Pt also demonstrating bizarre behaviors with irritable mood and labile affect today. Rodd painted his face with different colored paint and demonstrated labile mood when discussing d/c with MD and CSW. Pt moved to [address] and will stay at [facility] through the weekend for further medication management, evaluation, and monitoring. CSW contacted pt's brother to inform him about this change in d/c plan.

## 2013-03-04 NOTE — Progress Notes (Signed)
[facility] Adult Case Management Discharge Plan :  Will you be returning to the same living situation after discharge: Yes,  home  At discharge, do you have transportation home?:Yes,  brother or friend Do you have the ability to pay for your medications:Yes,  mental health  Release of information consent forms completed and in the chart;  Patient's signature needed at discharge.  Patient to Follow up at: Follow-up Information   Follow up with [name] On 03/09/2013. (Appt. at 8AM for hospital followup and assessment for therapy services. )    Contact information:   [address] [place], [place] [address] Phone: [phone] Fax: [phone]      Patient denies SI/HI:   Yes,  during group/self report.    Safety Planning and Suicide Prevention discussed:  Yes,  contact attempts made with pt's brother (voicemails left). SPE compleated with pt and he was given SPI pamphlet and encouraged to share with his support network.   Smart, [name] 03/04/2013, 11:10 AM

## 2013-03-04 NOTE — Progress Notes (Signed)
Psychoeducational Group Note  Date:  03/04/2013 Time:  2000  Group Topic/Focus:  Wrap-Up Group:   The focus of this group is to help patients review their daily goal of treatment and discuss progress on daily workbooks.  Participation Level: Did Not Attend  Participation Quality:  Not Applicable  Affect:  Not Applicable  Cognitive:  Not Applicable  Insight:  Not Applicable  Engagement in Group: Not Applicable  Additional Comments:  The patient dd not attend group this evening since he was asleep.   Hazle Coca S 03/04/2013, 10:24 PM

## 2013-03-04 NOTE — [facility] Group Notes (Signed)
[facility] LCSW Aftercare Discharge Planning Group Note   03/04/2013 10:12 AM  Participation Quality:  Appropriate   Mood/Affect:  Appropriate  Depression Rating:  0  Anxiety Rating:  0  Thoughts of Suicide:  No Will you contract for safety?   NA  Current AVH:  No  Plan for Discharge/Comments:  Pt stated that he is ready for d/c today. Pt reports that he does not have any withdrawal symptoms and is anxious to return home in order to work/save money, to avoid home foreclosure next week. Pt stated that he slept well and is eating well. Pt pleasant mood with bizarre affect-likely his baseline. He plans to "get back into church" in order to increase supports and likelihood of successful recovery. [facility] management and therapy services. He is not open to [facility] IOP.   Transportation Means: brother or friend   Supports: brother, community Engineer, materials, Research scientist (physical sciences)

## 2013-03-04 NOTE — Tx Team (Addendum)
Interdisciplinary Treatment Plan Update (Adult)  Date: 03/04/2013   Time Reviewed: 11:05 AM  Progress in Treatment:  Attending groups: Yes  Participating in groups:  Yes  Taking medication as prescribed: Yes  Tolerating medication: Yes  Family/Significant othe contact made: SPE completed with pt's brother. Patient understands diagnosis: Yes, AEB seeking treatment for ETOH detox, mood stabilization, and SI.  Discussing patient identified problems/goals with staff: Yes  Medical problems stabilized or resolved: Yes  Denies suicidal/homicidal ideation: Yes during group/self report.  Patient has not harmed self or Others: Yes  New problem(s) identified: Pt demonstrating bizarre behaviors with irritable mood and labile affect. Pt transferred to [address]. Discharge Plan or Barriers: Pt has follow up appt at [facility] and plans to rejoin his church for additional community support.  Additional comments:  57 Y/O male who states he has been drinking for 42 years. Has tried to quit, has been locked up for DWIs, [place], has to walk everywhere, has to bother people. Last DWI 2002 to get the license has to spend 5,000. 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 Has lost about 30 pounds. Drinks a pint and a 12 pack a day. Scalated during the last 2 months after he got the foreclusure notice. Has abstained for up to 2 years in jail, couple of 30 days on his own couple of times. . Reason for Continuation of Hospitalization: 2-3 days  Estimated length of stay:  Librium taper Mood stabilization Medication management Possible psychosis-delusional thinking  For review of initial/current patient goals, please see plan of care.  Attendees:  Patient:    Family:    Physician: Geoffery Lyons MD 03/04/2013 11:04 AM   Nursing: Estanislado Emms PA Student   03/04/2013 11:05 AM   Clinical Social Worker Lawrence Roldan Smart, LCSWA  03/04/2013 11:05 AM   Other:    Other:    Other:    Other:    Scribe for Treatment Team:  Trula Slade LCSWA 03/04/2013 11:05 AM

## 2013-03-04 NOTE — Progress Notes (Signed)
Patient ID: [name], male   DOB: Jan 10, 1956, 57 y.o.   MRN: [id number] AT 1500 - Resumed patient care. Patient irritable, agitated pacing up and down hallway and kicking at doors intermittently. Patient continues to be difficult to redirect, smeared toothpaste on face. Patient later found to put clothes into shower and leave shower on so that water was flooded in room.A.  Notified Dr. Dub Mikes and orders received for Geodon 20mg  Im STAT. Patient accepted injection and was cooperative with administration. Pt reassessed at 1630 resting quietly in bed. R. Patient remains resting quietly at this time. Will continue to monitor.

## 2013-03-04 NOTE — Progress Notes (Signed)
Patent in bed all evening sleeping. Writer went to patient's room couple of time to check on him. Writer about to arouse patient at about 2030. He sat up and drank his ensure but refused to take any other medications; "whatever they gave me earlier knocked me out". Writer unable to aces patient properly due to excessive sedation. Writer encouraged and supportive to patient. Patient receptive to encouragement and support. Q 15 minute check continues as ordered to maintain safety.

## 2013-03-04 NOTE — Progress Notes (Signed)
Recreation Therapy Notes  Date: 10.23.2014 Time: 3:00pm Location: [address]  Group Topic: Communication, Team Building, Problem Solving  Goal Area(s) Addresses:  Patient will effectively work with peer towards shared goal.  Patient will identify skill used to make activity successful.  Patient will identify how skills used during activity can be used to reach post d/c goals.   Behavioral Response: Engaged, Attentive, Appropraite  Intervention: Problem Solving Activitiy  Activity: Life Boat. Patients were given a scenario about being on a sinking yacht. Patients were informed the yacht included 15 guest, 8 of which could be placed on the life boat, along with all group members. Individuals on guest list were of varying socioeconomic classes such as a Education officer, museum, Materials engineer, Midwife, Tree surgeon.   Education: Customer service manager, Discharge Planning   Education Outcome: Needs additional education  Clinical Observations/Feedback: Patient actively engaged in group activity, working well with his team mates, sharing ideas and assisting with the construction of team's landing pad. Patient identified that he felt successful during group session because his team was able to work towards a shared goal. Patient additionally identified team work as a skill necessary to make activity successful. Patient related team work to his support system post d/c. Patient also related team work to building his support system by going to meetings and following a program post d/c.    Marykay Lex Stuart Mirabile, LRT/CTRS  Yee Gangi L 03/04/2013 8:34 AM

## 2013-03-05 MED ORDER — ZIPRASIDONE MESYLATE 20 MG IM SOLR: 20.0000 mg | INTRAMUSCULAR | Status: DC | PRN
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
## 2013-03-05 NOTE — [facility] Group Notes (Signed)
[facility] Group Notes: (Clinical Social Work)   03/05/2013      Type of Therapy:  Group Therapy   Participation Level:  Did Not Attend    Ambrose Mantle, LCSW 03/05/2013, 1:03 PM
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
## 2013-03-05 NOTE — Progress Notes (Signed)
[facility] MD Progress Note  03/05/2013 3:04 PM [name]  MRN:  [id number] Subjective:  Buzz was sleeping comfortably, and could not be easily aroused for an interview. Diagnosis:   DSM5: Schizophrenia Disorders:  Obsessive-Compulsive Disorders:  Trauma-Stressor Disorders:  Substance/Addictive Disorders: Alcohol Related Disorder - Severe (303.90)  Depressive Disorders:   Axis I: Bipolar, Manic and Mood Disorder NOS R/O  ADL's:  Intact  Sleep: Good  Appetite:  Good  Suicidal Ideation:  none Homicidal Ideation:  none AEB (as evidenced by):  Psychiatric Specialty Exam: Review of Systems  Unable to perform ROS   Blood pressure 99/65, pulse 96, temperature 99.1 F (37.3 C), temperature source Oral, resp. rate 16, height 5' 6.5" (1.689 m), weight 55.792 kg (123 lb).Body mass index is 19.56 kg/(m^2).  General Appearance: Casual  Eye Contact::  None  Speech:  asleep  Volume:  asleep  Mood:  asleep  Affect:  Congruent  Thought Process:  NA  Orientation:  NA  Thought Content:  NA  Suicidal Thoughts:  No  Homicidal Thoughts:  No  Memory:  NA  Judgement:  NA  Insight:  NA  Psychomotor Activity:  NA  Concentration:  NA  Recall:  NA  Akathisia:  NA  Handed:  Right  AIMS (if indicated):     Assets:    Sleep:  Number of Hours: 3.5   Current Medications: Current Facility-Administered Medications  Medication Dose Route Frequency Provider Last Rate Last Dose  . acetaminophen (TYLENOL) tablet 650 mg  650 mg Oral Q6H PRN Kerry Hough, PA-C      . alum & mag hydroxide-simeth (MAALOX/MYLANTA) 200-200-20 MG/5ML suspension 30 mL  30 mL Oral Q4H PRN Kerry Hough, PA-C      . chlordiazePOXIDE (LIBRIUM) capsule 25 mg  25 mg Oral Q6H PRN Kerry Hough, PA-C      . chlordiazePOXIDE (LIBRIUM) capsule 25 mg  25 mg Oral BH-qamhs Spencer E Simon, PA-C   25 mg at 03/05/13 0805   Followed by  . [START ON 03/06/2013] chlordiazePOXIDE (LIBRIUM) capsule 25 mg  25 mg Oral Daily Kerry Hough, PA-C      . divalproex (DEPAKOTE ER) 24 hr tablet 500 mg  500 mg Oral BID Rachael Fee, MD   500 mg at 03/05/13 0805  . feeding supplement (ENSURE COMPLETE) (ENSURE COMPLETE) liquid 237 mL  237 mL Oral BID BM Lavena Bullion, RD   237 mL at 03/05/13 1400  . hydrOXYzine (ATARAX/VISTARIL) tablet 25 mg  25 mg Oral Q6H PRN Kerry Hough, PA-C   25 mg at 03/04/13 1107  . loperamide (IMODIUM) capsule 2-4 mg  2-4 mg Oral PRN Kerry Hough, PA-C      . LORazepam (ATIVAN) tablet 1 mg  1 mg Oral Q6H PRN Rachael Fee, MD   1 mg at 03/05/13 0411  . magnesium hydroxide (MILK OF MAGNESIA) suspension 30 mL  30 mL Oral Daily PRN Kerry Hough, PA-C      . multivitamin with minerals tablet 1 tablet  1 tablet Oral Daily Kerry Hough, PA-C   1 tablet at 03/05/13 0805  . ondansetron (ZOFRAN-ODT) disintegrating tablet 4 mg  4 mg Oral Q6H PRN Kerry Hough, PA-C      . thiamine (B-1) injection 100 mg  100 mg Intramuscular Once [employer], PA-C      . thiamine (VITAMIN B-1) tablet 100 mg  100 mg Oral Daily Kerry Hough, PA-C  100 mg at 03/05/13 0805  . traZODone (DESYREL) tablet 100 mg  100 mg Oral QHS,MR X 1 Rachael Fee, MD   100 mg at 03/03/13 2143  . ziprasidone (GEODON) capsule 80 mg  80 mg Oral BID WC Rachael Fee, MD   80 mg at 03/05/13 0805  . ziprasidone (GEODON) injection 20 mg  20 mg Intramuscular Q2H PRN Jorje Guild, PA-C        Lab Results:  Results for orders placed during the hospital encounter of 03/02/13 (from the past 48 hour(s))  COMPREHENSIVE METABOLIC PANEL     Status: Abnormal   Collection Time    03/03/13  7:55 PM      Result Value Range   Sodium 129 (*) 135 - 145 mEq/L   Potassium 4.2  3.5 - 5.1 mEq/L   Chloride 92 (*) 96 - 112 mEq/L   CO2 27  19 - 32 mEq/L   Glucose, Bld 92  70 - 99 mg/dL   BUN 19  6 - 23 mg/dL   Creatinine, Ser 4.69  0.50 - 1.35 mg/dL   Calcium 9.4  8.4 - 62.9 mg/dL   Total Protein 7.2  6.0 - 8.3 g/dL   Albumin 3.5  3.5 - 5.2 g/dL    AST 67 (*) 0 - 37 U/L   ALT 42  0 - 53 U/L   Alkaline Phosphatase 101  39 - 117 U/L   Total Bilirubin 0.3  0.3 - 1.2 mg/dL   GFR calc non Af Amer >90  >90 mL/min   GFR calc Af Amer >90  >90 mL/min   Comment: (NOTE)     The eGFR has been calculated using the CKD EPI equation.     This calculation has not been validated in all clinical situations.     eGFR's persistently <90 mL/min signify possible Chronic Kidney     Disease.     Performed at [facility]    Physical Findings: AIMS: Facial and Oral Movements Muscles of Facial Expression: None, normal Lips and Perioral Area: None, normal Jaw: None, normal Tongue: None, normal,Extremity Movements Upper (arms, wrists, hands, fingers): None, normal Lower (legs, knees, ankles, toes): None, normal, Trunk Movements Neck, shoulders, hips: None, normal, Overall Severity Severity of abnormal movements (highest score from questions above): None, normal Incapacitation due to abnormal movements: None, normal Patient's awareness of abnormal movements (rate only patient's report): No Awareness, Dental Status Current problems with teeth and/or dentures?: No Does patient usually wear dentures?: No  CIWA:  CIWA-Ar Total: 4 COWS:     Treatment Plan Summary: Daily contact with patient to assess and evaluate symptoms and progress in treatment Medication management  Plan: Continue current plan  Medical Decision Making Problem Points:  Established problem, stable/improving (1) and Review of psycho-social stressors (1) Data Points:  Review or order clinical lab tests (1) Review of medication regiment & side effects (2)  I certify that inpatient services furnished can reasonably be expected to improve the patient's condition.   WATT,ALAN 03/05/2013, 3:04 PM Agree with assessment and plan Madie Reno A. Dub Mikes, M.D.

## 2013-03-06 MED ORDER — NICOTINE POLACRILEX 2 MG MT GUM
2.0000 mg | CHEWING_GUM | OROMUCOSAL | Status: DC | PRN
  Administered 2013-03-06: 2 mg via ORAL
  Filled 2013-03-06: qty 1
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
## 2013-03-06 NOTE — [facility] Group Notes (Signed)
[facility] Group Notes:  (Clinical Social Work)  03/06/2013   11:15am-12:00pm  Summary of Progress/Problems:  The main focus of today's process group was to listen to a variety of genres of music and to identify that different types of music provoke different responses.  The patient then was able to identify personally what was soothing for them, as well as energizing.  Handouts were used to record feelings evoked, as well as how patient can personally use this knowledge in sleep habits, with depression, and with other symptoms.  The patient expressed understanding of concepts, as well as knowledge of how each type of music affected them and how this can be used when they are at home as a tool in their recovery.  However, he did insist that his name was "[name]."  Type of Therapy:  Music Therapy   Participation Level:  Active  Participation Quality:  Attentive and Sharing  Affect:  Blunted and Excited  Cognitive:  Oriented  Insight:  Engaged  Engagement in Therapy:  Engaged  Modes of Intervention:   Activity, Exploration  Ambrose Mantle, LCSW 03/06/2013, 1:28 PM
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
## 2013-03-06 NOTE — [facility] Group Notes (Signed)
[facility] Group Notes:  (Nursing/MHT/Case Management/Adjunct)  Date:  03/06/2013  Time:  10:14 AM  Type of Therapy:  Psychoeducational Skills  Participation Level:  Minimal  Participation Quality:  Inattentive  Affect:  Labile  Cognitive:  Lacking and hypomanic/euphoric  Insight:  Lacking  Engagement in Group:  Limited  Modes of Intervention:  Discussion and Education  Summary of Progress/Problems: self inventory review with RN, healthy support systems review .  Patient during group states ''I'm good I'm blessed, you see that sun shining out there things are going to work out because you look outside and its a good day''   [name], Richelle Ito 03/06/2013, 10:14 AM
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
## 2013-03-07 NOTE — Progress Notes (Signed)
Date: 03/07/2013  Time: 12:00 PM  Group Topic/Focus:  Self Care: The focus of this group is to help patients understand the importance of self-care in order to improve or restore emotional, physical, spiritual, interpersonal, and financial health.  Participation Level: Active  Participation Quality: Appropriate, Sharing and Supportive  Affect: Appropriate  Cognitive: Appropriate and Oriented  Insight: Appropriate  Engagement in Group: Engaged and Supportive  Modes of Intervention: Discussion, Education, Problem-solving and Support  Additional Comments: Pt attended group.  Isla Pence M  03/07/2013, 12:00PM

## 2013-03-07 NOTE — Progress Notes (Signed)
Recreation Therapy Notes  Date: 10.27.2014 Time: 9:30am Location: [address] Day[address]  Group Topic: Emotional Recognition, Communication  Goal Area(s) Addresses:  Patient will successfully display emotions during group session.  Patient will identify importance of displaying emotions. Patient will identify impact of emotions on communication.   Behavioral Response: Engaged, Attentive, Monopolizing at times, Redirectable.   Intervention: Game  Activity:  Emotional Charades. Patients were asked to select an emotion from provided container, using only facial expressions and body gestures patients were asked to act out selected emotion. Patients were provided a list of emotions to assist them with this process.   Education: Engineer, civil (consulting), Communication, Discharge Planning.   Education Outcome: Acknowledges understanding   Clinical Observations/Feedback: Patient actively participated in group activity, acting out selected emotions appropriately and guessing selected emotions. Patient was very animated while acting out emotions, often using much of the space in the dayroom to act out the emotions he selected. During wrap up discussion patient shared various examples of how people have mis-read the emotions he was feeling and how he handled those situations, patient did this by standing in front of his chair and raising his voice, it appeared to be in an effort to draw attention to himself. Patient additionally walked up to LRT and started talking to her while another group member was talking. LRT was able to redirect patient to sit down and allow others to talk. Patient tolerated LRT redirection.   Marykay Lex Zaara Sprowl, LRT/CTRS  Murad Staples L 03/07/2013 1:07 PM

## 2013-03-07 NOTE — Progress Notes (Signed)
Pt remains visible on the unit, bizarrely dressed. Linen remains around head at times. Affect is anxious with congruent mood. He is restless and intrusive. Denies any issues and continues to show little insight into his illness and why he is still hospitalized. Medicated per orders. Support and frequent redirect given. He denies AVH/SI/HI and is presently resting in bed. [name]

## 2013-03-07 NOTE — Progress Notes (Addendum)
D: Pt denies SI/HI/AVH.  Pt stated he only see pretty women. Pt continues to flirt with staff and is redirected by staff for inappropriate remarks. No withdrawal symptoms verbalized by pt. None noted by Clinical research associate. Pt rates depression and hopelessness 1/10. A: Medications administered as ordered per MD. Verbal support given. Pt encouraged to attend groups. 15 minute checks performed for safety. R: No complaints verbalized by pt at this time. Pt compliant with taking meds and attending groups. Pt safety maintained.    Pt is hyper verbal and intrusive. Pt continues to be sexually inappropriate with staff. Pt continues to draw on his face with a crayon. Pt has a pillowcase wrapped around his head. Pt is argumentative and have difficulty following directions from staff.

## 2013-03-07 NOTE — [facility] Group Notes (Signed)
[facility] LCSW Group Therapy  03/07/2013 1:15 pm  Type of Therapy: Process Group Therapy  Participation Level:  Active  Participation Quality: Distracting  Affect:  Flat  Cognitive:  Oriented  Insight:  ILimited  Engagement in Group:  Limited  Engagement in Therapy:  None  Modes of Intervention:  Activity, Clarification, Education, Problem-solving and Support  Summary of Progress/Problems: Today's group addressed the issue of overcoming obstacles.  Patients were asked to identify their biggest obstacle post d/c that stands in the way of their on-going success, and then problem solve as to how to manage this.  [name] needed structure at the beginning of group as he was running his mouth a lot.  He responded appropriately initially, and  cited sobriety as his main goal.  However, he kept distracting to the point that I needed to dismiss him from group.  He went without argument.  [name] 03/07/2013   1:32 PM

## 2013-03-07 NOTE — Progress Notes (Signed)
Patient ID: [name], male   DOB: [birth year], 57 y.o.   MRN: [id number] [facility] MD Progress Note  03/07/2013 1:40 PM [name]  MRN:  [id number] Subjective:   Patient states "I don't understand why I am still here. Just because my brother called up and blocked my discharge. I can explain my behavior on last Friday. See I am part [place] so after my discharge was cancelled I went into war mode and painted my face. Then they moved me over here. I need to go because I have work to do and I'm afraid someone is going to harm my house." When asked about his bizarre appearance on the unit patient replied "I have a head cold and normally wear a bandana. I have these pencils so I can take notes during group." Patient talks at length about his fear of how his house could be blown up any minute and becomes visibly anxious when speaking about it.   Objective:  Patient is visible on the milieu and is attending the scheduled groups. He is observed to have a pillowcase wrapped around his head and has a green pencil in each ear. Patient remains with bizarre dress, paranoid ideation, and has no insight into reasons why he required continued hospitalization as he has no idea why his brother expressed concern to staff last week.   Diagnosis:   DSM5: Schizophrenia Disorders:  Obsessive-Compulsive Disorders:  Trauma-Stressor Disorders:  Substance/Addictive Disorders: Alcohol Related Disorder - Severe (303.90)  Depressive Disorders:   Axis I: Bipolar, Manic and Mood Disorder NOS R/O  ADL's:  Intact  Sleep: Good  Appetite:  Good  Suicidal Ideation:  Denies Homicidal Ideation:  Denies AEB (as evidenced by):  Psychiatric Specialty Exam: Review of Systems  Constitutional: Negative.   HENT: Negative.   Eyes: Negative.   Respiratory: Negative.   Cardiovascular: Negative.   Gastrointestinal: Negative.   Genitourinary: Negative.   Musculoskeletal: Negative.   Skin: Negative.   Neurological:  Negative.   Endo/Heme/Allergies: Negative.   Psychiatric/Behavioral: Positive for depression and substance abuse. Negative for suicidal ideas, hallucinations and memory loss. The patient is nervous/anxious. The patient does not have insomnia.     Blood pressure 98/69, pulse 112, temperature 97.4 F (36.3 C), temperature source Oral, resp. rate 20, height 5' 6.5" (1.689 m), weight 55.792 kg (123 lb).Body mass index is 19.56 kg/(m^2).  General Appearance: Bizarre  Eye Contact::  Good  Speech:  Pressured  Volume:  Normal  Mood:  Hypomanic  Affect:  Congruent  Thought Process:  Irrelevant and Tangential  Orientation:  Full (Time, Place, and Person)  Thought Content:  Obsessions  Suicidal Thoughts:  No  Homicidal Thoughts:  No  Memory:  Immediate;   Good  Judgement:  Impaired  Insight:  Lacking  Psychomotor Activity:  Increased  Concentration:  Good  Recall:  Good  Akathisia:  No  Handed:  Right  AIMS (if indicated):     Assets:  Resilience  Sleep:  Number of Hours: 4.5   Current Medications: Current Facility-Administered Medications  Medication Dose Route Frequency Provider Last Rate Last Dose  . acetaminophen (TYLENOL) tablet 650 mg  650 mg Oral Q6H PRN Kerry Hough, PA-C   650 mg at 03/06/13 2230  . alum & mag hydroxide-simeth (MAALOX/MYLANTA) 200-200-20 MG/5ML suspension 30 mL  30 mL Oral Q4H PRN Kerry Hough, PA-C      . divalproex (DEPAKOTE ER) 24 hr tablet 500 mg  500 mg Oral BID Reymundo Poll  Dub Mikes, MD   500 mg at 03/07/13 0751  . feeding supplement (ENSURE COMPLETE) (ENSURE COMPLETE) liquid 237 mL  237 mL Oral BID BM Lavena Bullion, RD   237 mL at 03/06/13 1934  . LORazepam (ATIVAN) tablet 1 mg  1 mg Oral Q6H PRN Rachael Fee, MD   1 mg at 03/05/13 0411  . magnesium hydroxide (MILK OF MAGNESIA) suspension 30 mL  30 mL Oral Daily PRN Kerry Hough, PA-C      . multivitamin with minerals tablet 1 tablet  1 tablet Oral Daily Kerry Hough, PA-C   1 tablet at 03/07/13 0751   . nicotine polacrilex (NICORETTE) gum 2 mg  2 mg Oral PRN Rachael Fee, MD   2 mg at 03/06/13 2128  . thiamine (B-1) injection 100 mg  100 mg Intramuscular Once [employer], PA-C      . thiamine (VITAMIN B-1) tablet 100 mg  100 mg Oral Daily Kerry Hough, PA-C   100 mg at 03/07/13 0750  . traZODone (DESYREL) tablet 100 mg  100 mg Oral QHS,MR X 1 Rachael Fee, MD   100 mg at 03/06/13 2115  . ziprasidone (GEODON) capsule 80 mg  80 mg Oral BID WC Rachael Fee, MD   80 mg at 03/07/13 0750  . ziprasidone (GEODON) injection 20 mg  20 mg Intramuscular Q2H PRN Jorje Guild, PA-C        Lab Results: No results found for this or any previous visit (from the past 48 hour(s)).  Physical Findings: AIMS: Facial and Oral Movements Muscles of Facial Expression: None, normal Lips and Perioral Area: None, normal Jaw: None, normal Tongue: None, normal,Extremity Movements Upper (arms, wrists, hands, fingers): None, normal Lower (legs, knees, ankles, toes): None, normal, Trunk Movements Neck, shoulders, hips: None, normal, Overall Severity Severity of abnormal movements (highest score from questions above): None, normal Incapacitation due to abnormal movements: None, normal Patient's awareness of abnormal movements (rate only patient's report): No Awareness, Dental Status Current problems with teeth and/or dentures?: No Does patient usually wear dentures?: No  CIWA:  CIWA-Ar Total: 0 COWS:     Treatment Plan Summary: Daily contact with patient to assess and evaluate symptoms and progress in treatment Medication management  Plan: Continue crisis management and stabilization.  Medication management: Reviewed with patient who stated no untoward effects. Continue Depakote 500 mg BID for improved stability of mood. Continue Geodon 80 mg BID for symptoms of paranoia.  Encouraged patient to attend groups and participate in group counseling sessions and activities.  Discharge plan in progress.   Continue current treatment plan.  Address health issues: Vitals reviewed and stable.   Medical Decision Making Problem Points:  Established problem, stable/improving (1) and Review of psycho-social stressors (1) Data Points:  Review or order clinical lab tests (1) Review of medication regiment & side effects (2)  I certify that inpatient services furnished can reasonably be expected to improve the patient's condition.   Jayln Branscom NP-C 03/07/2013, 1:40 PM

## 2013-03-08 LAB — VALPROIC ACID LEVEL: Valproic Acid Lvl: 45.6 ug/mL — ABNORMAL LOW (ref 50.0–100.0)

## 2013-03-08 LAB — COMPREHENSIVE METABOLIC PANEL
ALT: 30 U/L (ref 0–53)
AST: 33 U/L (ref 0–37)
Albumin: 3.5 g/dL (ref 3.5–5.2)
CO2: 27 mEq/L (ref 19–32)
Calcium: 9.5 mg/dL (ref 8.4–10.5)
Creatinine, Ser: 0.88 mg/dL (ref 0.50–1.35)
GFR calc non Af Amer: >90 mL/min (ref >90)
Sodium: 133 mEq/L — ABNORMAL LOW (ref 135–145)
Total Bilirubin: 0.3 mg/dL (ref 0.3–1.2)
Total Protein: 7.4 g/dL (ref 6.0–8.3)

## 2013-03-08 MED ORDER — ZIPRASIDONE HCL 80 MG PO CAPS
80.0000 mg | ORAL_CAPSULE | Freq: Every day | ORAL | Status: DC
  Administered 2013-03-08 – 2013-03-09 (×2): 80 mg via ORAL
  Filled 2013-03-08 (×3): qty 1

## 2013-03-08 MED ORDER — HYDROXYZINE HCL 25 MG PO TABS
25.0000 mg | ORAL_TABLET | Freq: Four times a day (QID) | ORAL | Status: DC | PRN
  Filled 2013-03-08: qty 20

## 2013-03-08 MED ORDER — ZIPRASIDONE HCL 60 MG PO CAPS
60.0000 mg | ORAL_CAPSULE | Freq: Two times a day (BID) | ORAL | Status: DC
  Filled 2013-03-08 (×2): qty 1

## 2013-03-08 NOTE — Progress Notes (Signed)
Seen and agreed. Camdyn Laden, MD 

## 2013-03-08 NOTE — [facility] Group Notes (Signed)
[facility] LCSW Group Therapy  03/08/2013 , 12:31 PM   Type of Therapy:  Group Therapy  Participation Level:  Active  Participation Quality:  Attentive  Affect:  Appropriate  Cognitive:  Alert  Insight:  Improving  Engagement in Therapy:  Engaged  Modes of Intervention:  Discussion, Exploration and Socialization  Summary of Progress/Problems: Today's group focused on the term Diagnosis.  Participants were asked to define the term, and then pronounce whether it is a negative, positive or neutral term.  Josie talked about his diagnosis of "being crazy," but with prompting backed off of that and was able to own his diagnosis of alcohol dependence.  He ended up leaving group for awhile, but upon return talked about how he wants to help others.  He used the example of giving another patient an extra shirt this AM.  But then he went on to talk about how this sometimes gets in the way of him taking care of himself, which then lead to him talking about unable to forgive himself for being in prison when his father passed.  Became tearful.  Daryel Gerald B 03/08/2013 , 12:31 PM

## 2013-03-08 NOTE — Progress Notes (Signed)
Pt cheeking Geodon during 1800 med administration.

## 2013-03-08 NOTE — Progress Notes (Signed)
Patient ID: HANLEY WOERNER, male   DOB: [birth year], 57 y.o.   MRN: [id number]  D: Pt denies SI/HI/AVH. Pt is pleasant and cooperative. Pt states his day was "great" for the most part until other pt's were yelling and screaming and that was giving him a H/A.   A: Pt was offered support and encouragement. Pt was given scheduled medications. Pt was encourage to attend groups. Q 15 minute checks were done for safety.   R:Pt attends groups and interacts well with peers and staff. Pt is taking medication. Pt has no complaints at this time.Pt receptive to treatment and safety maintained on unit.

## 2013-03-08 NOTE — [facility] Group Notes (Signed)
Adult Psychoeducational Group Note  Date:  03/08/2013 Time:  9:27 PM  Group Topic/Focus:  Wrap-Up Group:   The focus of this group is to help patients review their daily goal of treatment and discuss progress on daily workbooks.  Participation Level:  Active  Participation Quality:  Appropriate  Affect:  Appropriate  Cognitive:  Appropriate  Insight: Good  Engagement in Group:  Engaged  Modes of Intervention:  Discussion  Additional Comments:  Joon stated he had a fantastic day.  His goal was to have a positive attitude and help people.  Two positives about himself were he likes to talk and flirt.  Caroll Rancher A 03/08/2013, 9:27 PM

## 2013-03-08 NOTE — Progress Notes (Signed)
Patient ID: [name], male   DOB: 06-04-1955, 57 y.o.   MRN: [id number] [name] Progress Note  03/08/2013 10:59 AM [name]  MRN:  [id number] Subjective:  " I am doing fine but one of the morning medications has been making me really drowsy. I need you to discharge me soon because I have to take care of my house and other stuffs out there. And if you are worried about my drinking, I can tell you now that I am done, I have been drinking for 40 years." Patient states "I don't understand why I am still here. Just because my brother called up and blocked my discharge.  Objective: Patient still acting bizarre wrapping pillow case round his head and painting his face with color pencil. Patient reports decreased paranoia and mood swing. He refused his morning dose of Geodon because he thinks it is making him drowsy. Diagnosis:   DSM5: Schizophrenia Disorders:  Obsessive-Compulsive Disorders:  Trauma-Stressor Disorders:  Substance/Addictive Disorders: Alcohol Related Disorder - Severe (303.90)  Depressive Disorders:   Axis I: Bipolar 1 disorder Manic  Alcohol use disorder severe. ADL's:  Intact  Sleep: Good  Appetite:  Good  Suicidal Ideation:  Denies Homicidal Ideation:  Denies AEB (as evidenced by):  Psychiatric Specialty Exam: Review of Systems  Constitutional: Negative.   HENT: Negative.   Eyes: Negative.   Respiratory: Negative.   Cardiovascular: Negative.   Gastrointestinal: Negative.   Genitourinary: Negative.   Musculoskeletal: Negative.   Skin: Negative.   Neurological: Negative.   Endo/Heme/Allergies: Negative.   Psychiatric/Behavioral: Positive for depression and substance abuse. Negative for suicidal ideas, hallucinations and memory loss. The patient is nervous/anxious. The patient does not have insomnia.     Blood pressure 121/73, pulse 106, temperature 98.4 F (36.9 C), temperature source Oral, resp. rate 18, height 5' 6.5" (1.689 m), weight 55.792 kg (123  lb).Body mass index is 19.56 kg/(m^2).  General Appearance: fairly groomed  Patent attorney::  Good  Speech:  Pressured  Volume:  Normal  Mood:  Hypomanic  Affect:  Congruent  Thought Process:  bizarre  Orientation:  Full (Time, Place, and Person)  Thought Content:  Obsessions  Suicidal Thoughts:  No  Homicidal Thoughts:  No  Memory:  Immediate;   Good  Judgement:  Impaired  Insight:  Lacking  Psychomotor Activity:  Increased  Concentration:  Good  Recall:  Good  Akathisia:  No  Handed:  Right  AIMS (if indicated):     Assets:  Resilience  Sleep:  Number of Hours: 6.5   Current Medications: Current Facility-Administered Medications  Medication Dose Route Frequency Provider Last Rate Last Dose  . acetaminophen (TYLENOL) tablet 650 mg  650 mg Oral Q6H PRN Craig Hough, PA-C   650 mg at 03/08/13 0840  . alum & mag hydroxide-simeth (MAALOX/MYLANTA) 200-200-20 MG/5ML suspension 30 mL  30 mL Oral Q4H PRN Craig Hough, PA-C      . divalproex (DEPAKOTE ER) 24 hr tablet 500 mg  500 mg Oral BID Craig Fee, MD   500 mg at 03/08/13 0749  . feeding supplement (ENSURE COMPLETE) (ENSURE COMPLETE) liquid 237 mL  237 mL Oral BID BM [name], RD   237 mL at 03/07/13 2006  . hydrOXYzine (ATARAX/VISTARIL) tablet 25 mg  25 mg Oral Q6H PRN [name]      . magnesium hydroxide (MILK OF MAGNESIA) suspension 30 mL  30 mL Oral Daily PRN Craig Hough, PA-C      .  multivitamin with minerals tablet 1 tablet  1 tablet Oral Daily Craig Hough, PA-C   1 tablet at 03/08/13 0749  . nicotine polacrilex (NICORETTE) gum 2 mg  2 mg Oral PRN Craig Fee, MD   2 mg at 03/06/13 2128  . thiamine (B-1) injection 100 mg  100 mg Intramuscular Once [employer], PA-C      . thiamine (VITAMIN B-1) tablet 100 mg  100 mg Oral Daily Craig Hough, PA-C   100 mg at 03/08/13 0749  . traZODone (DESYREL) tablet 100 mg  100 mg Oral QHS,MR X 1 Craig Fee, MD   100 mg at 03/06/13 2115  . ziprasidone  (GEODON) capsule 80 mg  80 mg Oral QPC supper [name]      . ziprasidone (GEODON) injection 20 mg  20 mg Intramuscular Q2H PRN Craig Guild, PA-C        Lab Results:  Results for orders placed during the hospital encounter of 03/02/13 (from the past 48 hour(s))  VALPROIC ACID LEVEL     Status: Abnormal   Collection Time    03/07/13  7:30 PM      Result Value Range   Valproic Acid Lvl 45.6 (*) 50.0 - 100.0 ug/mL   Comment: Performed at [facility]    Physical Findings: AIMS: Facial and Oral Movements Muscles of Facial Expression: None, normal Lips and Perioral Area: None, normal Jaw: None, normal Tongue: None, normal,Extremity Movements Upper (arms, wrists, hands, fingers): None, normal Lower (legs, knees, ankles, toes): None, normal, Trunk Movements Neck, shoulders, hips: None, normal, Overall Severity Severity of abnormal movements (highest score from questions above): None, normal Incapacitation due to abnormal movements: None, normal Patient's awareness of abnormal movements (rate only patient's report): No Awareness, Dental Status Current problems with teeth and/or dentures?: No Does patient usually wear dentures?: No  CIWA:  CIWA-Ar Total: 0 COWS:     Treatment Plan Summary: Daily contact with patient to assess and evaluate symptoms and progress in treatment Medication management  Plan: Continue crisis management and stabilization.  Medication management: Reviewed with patient who stated no untoward effects. Continue Depakote 500 mg BID for improved stability of mood. Decrease Geodon to 80 mg Qhs for  paranoia.  Encouraged patient to attend groups and participate in group counseling sessions and activities.  Discharge plan in progress.  Continue current treatment plan.  Address health issues: Vitals reviewed and stable.   Medical Decision Making Problem Points:  Established problem, stable/improving (1) and Review of psycho-social stressors (1) Data  Points:  Review or order clinical lab tests (1) Review of medication regiment & side effects (2)  I certify that inpatient services furnished can reasonably be expected to improve the patient's condition.   Craig Mins, MD 03/08/2013, 10:59 AM

## 2013-03-08 NOTE — Progress Notes (Signed)
D: Pt denies SI/HI/AVH. Pt c/o feeling groggy this morning and tired. Pt refused morning dose of Geodon. Pt anxious this morning and continues to draw on his face with a crayon. Pt stated, "I might as well, I can't go home". Writer continues to redirect pt and encouraged him to stop drawing on his face. Pt continues to be flirtatious with staff and make inappropriate remarks. A: Medications administered as ordered per MD. Earnestine Leys decreased per MD. Verbal support given. Pt encouraged to attend groups. 15 minute checks performed for safety. R: Pt safety maintained.

## 2013-03-09 NOTE — Progress Notes (Signed)
Patient ID: [name], male   DOB: 07-Feb-1956, 57 y.o.   MRN: [id number] D: pt. Lying in bed, denies SHI, AVH. "I ain't having no depression" Pt. Refuses ensure at this time. A: Writer introduced self to client and encourages group. Staff will monitor q2min for safety. R: Pt. Is safe on the unit. Pt. Did not attend group.

## 2013-03-09 NOTE — Progress Notes (Signed)
Recreation Therapy Notes  Date: 10.29.2014 Time: 9:30am Location: [address]  Group Topic: Leisure Education  Goal Area(s) Addresses:  Patient will verbalize activity of interest by end of group session. Patient will verbalize the ability to use positive leisure/recreation as a coping mechanism.  Behavioral Response: Appropriate   Intervention: Game  Activity: Leisure ABC's. LRT wrote the alphabet on the white board in the dayroom. Patients were asked to identify a leisure activity to correspond with each letter of the alphabet.  Education:  Leisure Education, Building control surveyor, Coping Skills  Education Outcome: Acknowledges understanding  Clinical Observations/Feedback: Patient actively engaged in group session, identified appropriate activities to correspond with letters of the alphabet. Patient defined leisure as down time and stated that a benefit of leisure is that it can help relax you.   Marykay Lex Jaquanna Ballentine, LRT/CTRS  Jearl Klinefelter 03/09/2013 12:56 PM

## 2013-03-09 NOTE — Progress Notes (Signed)
Patient ID: [name], male   DOB: 1955/11/07, 57 y.o.   MRN: [id number]  [facility] MD Progress Note  03/09/2013 1:54 PM TAKSH HJORT  MRN:  [id number] Subjective:   Patient states "I've been clean from ETOH for seven days! I have my face painted for Halloween and also because I am part [place] and I'm in hunting mode. I'm not nuts. I'm ready to go home."  Objective: Patient still acting bizarre wrapping pillow case round his head and painting his face with color pencil. Today he is also observed wearing a clean green sticker tag that is used for hospital equipment to display his sobriety to others. The patient continues to exhibit manic behaviors with rapid and tangential speech. Olly has no insight into his mental illness but talks a great deal about "Not touching another drop of alcohol. I need to go volunteer so I can prevent people from going down the same path as me."   Diagnosis:   DSM5: Schizophrenia Disorders:  Obsessive-Compulsive Disorders:  Trauma-Stressor Disorders:  Substance/Addictive Disorders: Alcohol Related Disorder - Severe (303.90)  Depressive Disorders:   Axis I: Bipolar 1 disorder Manic  Alcohol use disorder severe. ADL's:  Intact  Sleep: Good  Appetite:  Good  Suicidal Ideation:  Denies Homicidal Ideation:  Denies AEB (as evidenced by):  Psychiatric Specialty Exam: Review of Systems  Constitutional: Negative.   HENT: Negative.   Eyes: Negative.   Respiratory: Negative.   Cardiovascular: Negative.   Gastrointestinal: Negative.   Genitourinary: Negative.   Musculoskeletal: Negative.   Skin: Negative.   Neurological: Negative.   Endo/Heme/Allergies: Negative.   Psychiatric/Behavioral: Positive for depression and substance abuse. Negative for suicidal ideas, hallucinations and memory loss. The patient is nervous/anxious. The patient does not have insomnia.     Blood pressure 113/77, pulse 114, temperature 97.9 F (36.6 C), temperature source Oral,  resp. rate 18, height 5' 6.5" (1.689 m), weight 55.792 kg (123 lb).Body mass index is 19.56 kg/(m^2).  General Appearance: Fairly Groomed  Patent attorney::  Good  Speech:  Pressured  Volume:  Normal  Mood:  Hypomanic  Affect:  Congruent  Thought Process:  bizarre  Orientation:  Full (Time, Place, and Person)  Thought Content:  Obsessions  Suicidal Thoughts:  No  Homicidal Thoughts:  No  Memory:  Immediate;   Good  Judgement:  Impaired  Insight:  Lacking  Psychomotor Activity:  Increased  Concentration:  Good  Recall:  Good  Akathisia:  No  Handed:  Right  AIMS (if indicated):     Assets:  Resilience  Sleep:  Number of Hours: 5.75   Current Medications: Current Facility-Administered Medications  Medication Dose Route Frequency Provider Last Rate Last Dose  . acetaminophen (TYLENOL) tablet 650 mg  650 mg Oral Q6H PRN Kerry Hough, PA-C   650 mg at 03/08/13 1528  . alum & mag hydroxide-simeth (MAALOX/MYLANTA) 200-200-20 MG/5ML suspension 30 mL  30 mL Oral Q4H PRN Kerry Hough, PA-C      . divalproex (DEPAKOTE ER) 24 hr tablet 500 mg  500 mg Oral BID Rachael Fee, MD   500 mg at 03/09/13 0742  . feeding supplement (ENSURE COMPLETE) (ENSURE COMPLETE) liquid 237 mL  237 mL Oral BID BM Lavena Bullion, RD   237 mL at 03/08/13 1457  . hydrOXYzine (ATARAX/VISTARIL) tablet 25 mg  25 mg Oral Q6H PRN Mojeed Akintayo      . magnesium hydroxide (MILK OF MAGNESIA) suspension 30 mL  30  mL Oral Daily PRN Kerry Hough, PA-C      . multivitamin with minerals tablet 1 tablet  1 tablet Oral Daily Kerry Hough, PA-C   1 tablet at 03/09/13 4098  . nicotine polacrilex (NICORETTE) gum 2 mg  2 mg Oral PRN Rachael Fee, MD   2 mg at 03/06/13 2128  . thiamine (B-1) injection 100 mg  100 mg Intramuscular Once [employer], PA-C      . thiamine (VITAMIN B-1) tablet 100 mg  100 mg Oral Daily Kerry Hough, PA-C   100 mg at 03/09/13 0742  . traZODone (DESYREL) tablet 100 mg  100 mg Oral QHS,MR X  1 Rachael Fee, MD   100 mg at 03/08/13 2105  . ziprasidone (GEODON) capsule 80 mg  80 mg Oral QPC supper Mojeed Akintayo   80 mg at 03/08/13 1808  . ziprasidone (GEODON) injection 20 mg  20 mg Intramuscular Q2H PRN Jorje Guild, PA-C        Lab Results:  Results for orders placed during the hospital encounter of 03/02/13 (from the past 48 hour(s))  VALPROIC ACID LEVEL     Status: Abnormal   Collection Time    03/07/13  7:30 PM      Result Value Range   Valproic Acid Lvl 45.6 (*) 50.0 - 100.0 ug/mL   Comment: Performed at [facility]  COMPREHENSIVE METABOLIC PANEL     Status: Abnormal   Collection Time    03/08/13  8:11 PM      Result Value Range   Sodium 133 (*) 135 - 145 mEq/L   Potassium 4.0  3.5 - 5.1 mEq/L   Chloride 97  96 - 112 mEq/L   CO2 27  19 - 32 mEq/L   Glucose, Bld 105 (*) 70 - 99 mg/dL   BUN 14  6 - 23 mg/dL   Creatinine, Ser 1.19  0.50 - 1.35 mg/dL   Calcium 9.5  8.4 - 14.7 mg/dL   Total Protein 7.4  6.0 - 8.3 g/dL   Albumin 3.5  3.5 - 5.2 g/dL   AST 33  0 - 37 U/L   ALT 30  0 - 53 U/L   Alkaline Phosphatase 62  39 - 117 U/L   Total Bilirubin 0.3  0.3 - 1.2 mg/dL   GFR calc non Af Amer >90  >90 mL/min   GFR calc Af Amer >90  >90 mL/min   Comment: (NOTE)     The eGFR has been calculated using the CKD EPI equation.     This calculation has not been validated in all clinical situations.     eGFR's persistently <90 mL/min signify possible Chronic Kidney     Disease.     Performed at [facility]    Physical Findings: AIMS: Facial and Oral Movements Muscles of Facial Expression: None, normal Lips and Perioral Area: None, normal Jaw: None, normal Tongue: None, normal,Extremity Movements Upper (arms, wrists, hands, fingers): None, normal Lower (legs, knees, ankles, toes): None, normal, Trunk Movements Neck, shoulders, hips: None, normal, Overall Severity Severity of abnormal movements (highest score from questions above): None,  normal Incapacitation due to abnormal movements: None, normal Patient's awareness of abnormal movements (rate only patient's report): No Awareness, Dental Status Current problems with teeth and/or dentures?: No Does patient usually wear dentures?: No  CIWA:  CIWA-Ar Total: 0 COWS:     Treatment Plan Summary: Daily contact with patient to assess and evaluate  symptoms and progress in treatment Medication management  Plan: Continue crisis management and stabilization.  Medication management: Reviewed with patient who stated no untoward effects. Continue Depakote 500 mg BID for improved stability of mood. Decrease Geodon to 80 mg Qhs for paranoia due to patient complaints of sedation.  Encouraged patient to attend groups and participate in group counseling sessions and activities.  Discharge plan in progress. Anticipate d/c on Friday.  Continue current treatment plan.  Address health issues: Vitals reviewed and stable.   Medical Decision Making Problem Points:  Established problem, stable/improving (1) and Review of psycho-social stressors (1) Data Points:  Review or order clinical lab tests (1) Review of medication regiment & side effects (2)  I certify that inpatient services furnished can reasonably be expected to improve the patient's condition.   Fransisca Kaufmann, NP-C 03/09/2013, 1:54 PM

## 2013-03-09 NOTE — Progress Notes (Signed)
D: Pt denies SI/HI/AVH. Pt is anxious and hyper verbal this morning. Pt continues to walk around with a pillowcase on his head. Pt continues to draw on his face with a crayon. Pt presents with flight of ideas, pacing, inappropriate remarks and sexually inappropriate. Pt continues to need redirecting by staff, pt argumentative with staff. Pt silly and childlike. A: Medications administered as ordered per MD. Verbal support given. Pt encouraged to attend groups. 15 minute checks performed for safety. R: Pt refusing to take Geodon this morning, dose scheduled for after supper. Pt safety maintained.

## 2013-03-09 NOTE — BHH Group Notes (Signed)
[facility] LCSW Aftercare Discharge Planning Group Note   03/09/2013 11:13 AM  Participation Quality:  Active   Mood/Affect:  Appropriate  Depression Rating:  2  Anxiety Rating:  3  Thoughts of Suicide:  No Will you contract for safety?   NA  Current AVH:  No  Plan for Discharge/Comments:  Leam will D/C tomorrow, however, he indicated that his brother would like him to stay.  Derrien was concerned about his IVC court date tomorrow.  CSW informed him that he does not need to worry about it.   CSW asked about his compliance with his medication.  He stated that staff had to force medication on him last night, and reiterated his stance that he does not want to take medications for fear of "developing dependence."  Did not identify any changes due to this.  Stated that he feels super every day.    Transportation Means: family  Supports:  family  Simona Huh

## 2013-03-09 NOTE — Tx Team (Signed)
  Interdisciplinary Treatment Plan Update   Date Reviewed:  03/09/2013  Time Reviewed:  10:57 AM  Progress in Treatment:   Attending groups: Yes Participating in groups: Yes Taking medication as prescribed: Yes  Tolerating medication: Yes Family/Significant other contact made: Yes  Patient understands diagnosis: Yes  Discussing patient identified problems/goals with staff: Yes Medical problems stabilized or resolved: Yes Denies suicidal/homicidal ideation: Yes  In tx team Patient has not harmed self or others: Yes  For review of initial/current patient goals, please see plan of care.  Estimated Length of Stay:  1-2 days  Reason for Continuation of Hospitalization: Medication stabilization Other; describe Mood lability/anger  New Problems/Goals identified:  N/A  Discharge Plan or Barriers:   return home, follow up outpt  Additional Comments:  Woodley finds it easier to distract self and others rather than focus on treatment, but in group will respond to limits and structure.  He is reluctant to take medication "because I don't want to get hooked on it" and has not identified a comprehensive sobriety plan.  Attendees:  Signature: Thedore Mins, MD 03/09/2013 10:57 AM   Signature: Richelle Ito, LCSW 03/09/2013 10:57 AM  Signature: Fransisca Kaufmann, NP 03/09/2013 10:57 AM  Signature: Joslyn Devon, RN 03/09/2013 10:57 AM  Signature: Liborio Nixon, RN 03/09/2013 10:57 AM  Signature:  03/09/2013 10:57 AM  Signature:   03/09/2013 10:57 AM  Signature:    Signature:    Signature:    Signature:    Signature:    Signature:      Scribe for Treatment Team:   Richelle Ito, LCSW  03/09/2013 10:57 AM

## 2013-03-09 NOTE — BHH Group Notes (Signed)
[facility] [employer] Group Therapy  03/09/2013 , 1:25 PM    Type of Therapy:  [employer] Presentation  Participation Level:  Active  Participation Quality:  Attentive  Affect:  Blunted  Cognitive:  Oriented  Insight:  Limited  Engagement in Therapy:  Engaged  Modes of Intervention:  Discussion, Education and Socialization  Summary of Progress/Problems:  Onalee Hua from [employer] came to present his recovery story and play the guitar.  Needed to be redirected immediately when he began telling a war story based on a reference by the presenter.  Of course, he continued to interrupt and need redirection throughout the group.    Daryel Gerald B 03/09/2013 , 1:25 PM

## 2013-03-10 DIAGNOSIS — F319 Bipolar disorder, unspecified: Secondary | ICD-10-CM

## 2013-03-10 MED ORDER — DIVALPROEX SODIUM ER 500 MG PO TB24: 500.0000 mg | ORAL_TABLET | Freq: Two times a day (BID) | ORAL | Status: DC

## 2013-03-10 MED ORDER — ZIPRASIDONE HCL 80 MG PO CAPS: 80.0000 mg | ORAL_CAPSULE | Freq: Every day | ORAL | Status: DC

## 2013-03-10 MED ORDER — HYDROXYZINE HCL 25 MG PO TABS: 25.0000 mg | ORAL_TABLET | Freq: Four times a day (QID) | ORAL | Status: DC | PRN

## 2013-03-10 MED ORDER — TRAZODONE HCL 100 MG PO TABS: 100.0000 mg | ORAL_TABLET | Freq: Every evening | ORAL | Status: DC | PRN

## 2013-03-10 NOTE — Discharge Summary (Signed)
Physician Discharge Summary Note  Patient:  [name] is an 57 y.o., male MRN:  [id number] DOB:  26-Nov-1955 Patient phone:  [phone] (home)  Patient address:   [address] [place] [address],   Date of Admission:  03/02/2013 Date of Discharge: 03/10/13  Reason for Admission:  ETOH detox   Discharge Diagnoses: Active Problems:   Alcohol dependence   Alcohol withdrawal   Depressive disorder, not elsewhere classified  Review of Systems  Constitutional: Negative.   HENT: Negative.   Eyes: Negative.   Respiratory: Negative.   Cardiovascular: Negative.   Gastrointestinal: Negative.   Genitourinary: Negative.   Musculoskeletal: Negative.   Skin: Negative.   Neurological: Negative.   Endo/Heme/Allergies: Negative.   Psychiatric/Behavioral: Negative for depression, suicidal ideas, hallucinations, memory loss and substance abuse. The patient is not nervous/anxious and does not have insomnia.     DSM5: AXIS I: Alcohol use disorder severe. Unspecified Bipolar disorder  AXIS II: Deferred  AXIS III: History reviewed. No pertinent past medical history.  AXIS IV: economic problems, other psychosocial or environmental problems and problems related to social environment  AXIS V: 61-70 mild symptoms  Level of Care:  OP  Hospital Course:   [name] is a 57 year old male who states he has been drinking for 42 years. Has tried to quit, has been locked up for DWIs, [place], has to walk everywhere, has to bother people. Last DWI 2002 to get the license has to spend 5,000. 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 Has lost about 30 pounds. Drinks a pint and a 12 pack a day. Patient reports things have gotten worse during the last 2 months after he got the foreclusure notice. Has abstained for up to 2 years in jail, couple of  30 days on his own couple of times.          [name] was admitted to the adult 300 unit. He was evaluated and his symptoms were identified. Medication management was discussed and initiated. He was oriented to the unit and encouraged to participate in unit programming. Medical problems were identified and treated appropriately. Home medication was restarted as needed. After the completing detox from alcohol the patient was found stable for discharge. However, his brother expressed concern about some bizarre behavior he had noticed. After his discharge was cancelled the patient began painting his face and became agitated. He was moved to the 400 due to concerns that he had become manic. The patient showed poor insight into his symptoms stating "I am part [place] and I just went into war-mode after my discharge was cancelled. Nothing is wrong with me." The patient was started on the mood stabilizing medication Depakote at 500 mg BID for improved mood stability. He was also started on Geodon. The patient complained of excessive sedation and the dose of this medication was decreased by the MD. [name] continued to display odd behaviors as he continued to mark on his face and wear a pillowcase around his head. When asked about these behaviors he quickly could give a rationale such as "I am wearing the pillowcase because I normally wear a bandanna. I don't like my thinning hair." These actions were interpreted by staff that the patient was still manic. However, the patient's behavior began to stabilize after being  on his medications for several days.         The patient was evaluated each day by a clinical provider to ascertain the patient's response to treatment.  Improvement was noted by the patient's report of decreasing symptoms, improved sleep and appetite, affect, medication tolerance, behavior, and participation in unit programming.  [name] was asked each day to complete a self inventory noting mood, mental  status, pain, new symptoms, anxiety and concerns.         He responded well to medication and being in a therapeutic and supportive environment. Positive and appropriate behavior was noted and the patient was motivated for recovery.  [name] worked closely with the treatment team and case manager to develop a discharge plan with appropriate goals. Coping skills, problem solving as well as relaxation therapies were also part of the unit programming.         By the day of discharge [name]  was in much improved condition than upon admission.  Symptoms were reported as significantly decreased or resolved completely.  The patient denied SI/HI and voiced no AVH. He was motivated to continue taking medication with a goal of continued improvement in mental health.          [name] was discharged home with a plan to follow up as noted below.  Consults:  None  Significant Diagnostic Studies:  labs: Routine admission labs  Discharge Vitals:   Blood pressure 100/64, pulse 96, temperature 98.4 F (36.9 C), temperature source Oral, resp. rate 18, height 5' 6.5" (1.689 m), weight 55.792 kg (123 lb). Body mass index is 19.56 kg/(m^2). Lab Results:   Results for orders placed during the hospital encounter of 03/02/13 (from the past 72 hour(s))  VALPROIC ACID LEVEL     Status: Abnormal   Collection Time    03/07/13  7:30 PM      Result Value Range   Valproic Acid Lvl 45.6 (*) 50.0 - 100.0 ug/mL   Comment: Performed at [facility]  COMPREHENSIVE METABOLIC PANEL     Status: Abnormal   Collection Time    03/08/13  8:11 PM      Result Value Range   Sodium 133 (*) 135 - 145 mEq/L   Potassium 4.0  3.5 - 5.1 mEq/L   Chloride 97  96 - 112 mEq/L   CO2 27  19 - 32 mEq/L   Glucose, Bld 105 (*) 70 - 99 mg/dL   BUN 14  6 - 23 mg/dL   Creatinine, Ser 4.09  0.50 - 1.35 mg/dL   Calcium 9.5  8.4 - 81.1 mg/dL   Total Protein 7.4  6.0 - 8.3 g/dL   Albumin 3.5  3.5 - 5.2 g/dL   AST 33  0 - 37 U/L   ALT 30   0 - 53 U/L   Alkaline Phosphatase 62  39 - 117 U/L   Total Bilirubin 0.3  0.3 - 1.2 mg/dL   GFR calc non Af Amer >90  >90 mL/min   GFR calc Af Amer >90  >90 mL/min   Comment: (NOTE)     The eGFR has been calculated using the CKD EPI equation.     This calculation has not been validated in all clinical situations.     eGFR's persistently <90 mL/min signify possible Chronic Kidney     Disease.     Performed at [facility]    Physical Findings: AIMS: Facial and Oral Movements Muscles of Facial Expression:  None, normal Lips and Perioral Area: None, normal Jaw: None, normal Tongue: None, normal,Extremity Movements Upper (arms, wrists, hands, fingers): None, normal Lower (legs, knees, ankles, toes): None, normal, Trunk Movements Neck, shoulders, hips: None, normal, Overall Severity Severity of abnormal movements (highest score from questions above): None, normal Incapacitation due to abnormal movements: None, normal Patient's awareness of abnormal movements (rate only patient's report): No Awareness, Dental Status Current problems with teeth and/or dentures?: No Does patient usually wear dentures?: No  CIWA:  CIWA-Ar Total: 0 COWS:     Psychiatric Specialty Exam: See Psychiatric Specialty Exam and Suicide Risk Assessment completed by Attending Physician prior to discharge.  Discharge destination:  Home  Is patient on multiple antipsychotic therapies at discharge:  No   Has Patient had three or more failed trials of antipsychotic monotherapy by history:  No  Recommended Plan for Multiple Antipsychotic Therapies: NA  Discharge Orders   Future Orders Complete By Expires   Diet - low sodium heart healthy  As directed    Discharge instructions  As directed    Comments:     You have not been given any medications to continue at home. Please follow up as directed. Please avoid all alcohol and drugs. If you feel that your symptoms are returning please report to the  nearest Emergency Department.   Increase activity slowly  As directed        Medication List       Indication   divalproex 500 MG 24 hr tablet  Commonly known as:  DEPAKOTE ER  Take 1 tablet (500 mg total) by mouth 2 (two) times daily.   Indication:  Manic Phase of Manic-Depression     hydrOXYzine 25 MG tablet  Commonly known as:  ATARAX/VISTARIL  Take 1 tablet (25 mg total) by mouth every 6 (six) hours as needed for anxiety.      ziprasidone 80 MG capsule  Commonly known as:  GEODON  Take 1 capsule (80 mg total) by mouth daily after supper.   Indication:  Manic-Depression           Follow-up Information   Follow up with Arna Medici On 03/15/2013. (Appt. between 8:30 and 10:00AM for your hospital followup and assessment for therapy services. Referral # [id number])    Contact information:   [address] [place], [place] [address] Phone: [phone] Fax: [phone]      Follow-up recommendations:   Activity: as tolerated  Diet: healthy  Tests: Valproic acid level: 45.6  Other: patient to keep his after care appointment   Comments:    Take all your medications as prescribed by your mental healthcare provider.  Report any adverse effects and or reactions from your medicines to your outpatient provider promptly.  Patient is instructed and cautioned to not engage in alcohol and or illegal drug use while on prescription medicines.  In the event of worsening symptoms, patient is instructed to call the crisis hotline, 911 and or go to the nearest ED for appropriate evaluation and treatment of symptoms.  Follow-up with your primary care provider for your other medical issues, concerns and or health care needs.   Total Discharge Time:  Greater than 30 minutes.  SignedFransisca Kaufmann NP-C 03/10/2013, 9:41 AM

## 2013-03-10 NOTE — Progress Notes (Signed)
D:  Patient discharged to home.  All belongings retrieved from room and from locker in search room.  Denies depressive symptoms or thoughts of self harm.  Reports appetite and sleep are good.   A:  Reviewed all discharge instructions, medications, and follow up care.  Patient was given a two week supply of medications from the hospital pharmacy.  He was escorted off the unit, to the search room, and out to the front lobby.   R:  Patient verbalized understanding of all instructions.  State he feels ready to leave and has a plan for sobriety on return to the community.  States he still wants to pursue longer term rehab.

## 2013-03-10 NOTE — BHH Suicide Risk Assessment (Signed)
Suicide Risk Assessment  Discharge Assessment     Demographic Factors:  Male, Caucasian, Low socioeconomic status and Unemployed  Mental Status Per Nursing Assessment::   On Admission:  NA  Current Mental Status by Physician: patient denies suicide ideation, intent or plan  Loss Factors: Financial problems/change in socioeconomic status  Historical Factors: Impulsivity  Risk Reduction Factors:   family support  Continued Clinical Symptoms:  Alcohol/Substance Abuse/Dependencies  Cognitive Features That Contribute To Risk:  Closed-mindedness Polarized thinking    Suicide Risk:  Minimal: No identifiable suicidal ideation.  Patients presenting with no risk factors but with morbid ruminations; may be classified as minimal risk based on the severity of the depressive symptoms  Discharge Diagnoses:   AXIS I:  Alcohol use disorder severe. Unspecified Bipolar  disorder AXIS II:  Deferred AXIS III:  History reviewed. No pertinent past medical history. AXIS IV:  economic problems, other psychosocial or environmental problems and problems related to social environment AXIS V:  61-70 mild symptoms  Plan Of Care/Follow-up recommendations:  Activity:  as tolerated Diet:  healthy Tests:  Valproic acid level: 45.6 Other:  patient to keep his after care appointment  Is patient on multiple antipsychotic therapies at discharge:  No   Has Patient had three or more failed trials of antipsychotic monotherapy by history:  No  Recommended Plan for Multiple Antipsychotic Therapies: NA  Thedore Mins, MD 03/10/2013, 10:02 AM

## 2013-03-10 NOTE — Progress Notes (Signed)
Seen and agreed. Kyo Cocuzza, MD 

## 2013-03-10 NOTE — Tx Team (Signed)
  Interdisciplinary Treatment Plan Update   Date Reviewed:  03/10/2013  Time Reviewed:  5:15 PM  Progress in Treatment:   Attending groups: Yes Participating in groups: Yes Taking medication as prescribed: Yes  Tolerating medication: Yes Family/Significant other contact made: Yes  Patient understands diagnosis: Yes  Discussing patient identified problems/goals with staff: Yes Medical problems stabilized or resolved: Yes Denies suicidal/homicidal ideation: Yes Patient has not harmed self or others: Yes  For review of initial/current patient goals, please see plan of care.  Estimated Length of Stay:  D/C today  Reason for Continuation of Hospitalization:   New Problems/Goals identified:  N/A  Discharge Plan or Barriers:   return home, follow up outp  Additional Comments:  Attendees:  Signature: Thedore Mins, MD 03/10/2013 5:15 PM   Signature: Richelle Ito, LCSW 03/10/2013 5:15 PM  Signature: Fransisca Kaufmann, NP 03/10/2013 5:15 PM  Signature: Joslyn Devon, RN 03/10/2013 5:15 PM  Signature: Liborio Nixon, RN 03/10/2013 5:15 PM  Signature:  03/10/2013 5:15 PM  Signature:   03/10/2013 5:15 PM  Signature:    Signature:    Signature:    Signature:    Signature:    Signature:      Scribe for Treatment Team:   Richelle Ito, LCSW  03/10/2013 5:15 PM

## 2013-03-10 NOTE — Progress Notes (Signed)
[facility] Adult Case Management Discharge Plan :  Will you be returning to the same living situation after discharge: Yes,  home At discharge, do you have transportation home?:Yes,  brother Do you have the ability to pay for your medications:Yes,  mental health  Release of information consent forms completed and in the chart;  Patient's signature needed at discharge.  Patient to Follow up at: Follow-up Information   Follow up with Arna Medici On 03/15/2013. (Appt. between 8:30 and 10:00AM for your hospital followup and assessment for therapy services. Referral # [id number])    Contact information:   [address] [place], [place] [address] Phone: [phone] Fax: [phone]      Patient denies SI/HI:   Yes,  yes    Safety Planning and Suicide Prevention discussed:  Yes,  yes  Ida Rogue 03/10/2013, 5:17 PM

## 2013-03-11 NOTE — Discharge Summary (Signed)
Seen and agreed. Carla Whilden, MD 

## 2013-03-14 NOTE — Progress Notes (Signed)
Patient Discharge Instructions:  After Visit Summary (AVS):   Faxed to:  03/14/13 Discharge Summary Note:   Faxed to:  03/14/13 Psychiatric Admission Assessment Note:   Faxed to:  03/14/13 Suicide Risk Assessment - Discharge Assessment:   Faxed to:  03/14/13 Faxed/Sent to the Next Level Care provider:  03/14/13 Faxed to [facility] @ [phone]  Jerelene Redden, 03/14/2013, 4:05 PM

## 2013-03-17 ENCOUNTER — Encounter (HOSPITAL_COMMUNITY): Payer: Self-pay | Admitting: Emergency Medicine

## 2013-03-17 ENCOUNTER — Emergency Department (HOSPITAL_COMMUNITY)
Admission: EM | Admit: 2013-03-17 | Discharge: 2013-03-18 | Disposition: A | Discharge status: Home / Self Care | Payer: Self-pay | Attending: Emergency Medicine | Admitting: Emergency Medicine

## 2013-03-17 DIAGNOSIS — Z79899 Other long term (current) drug therapy: Secondary | ICD-10-CM | POA: Insufficient documentation

## 2013-03-17 DIAGNOSIS — F3110 Bipolar disorder, current episode manic without psychotic features, unspecified: Secondary | ICD-10-CM

## 2013-03-17 DIAGNOSIS — T43505A Adverse effect of unspecified antipsychotics and neuroleptics, initial encounter: Secondary | ICD-10-CM | POA: Insufficient documentation

## 2013-03-17 DIAGNOSIS — F319 Bipolar disorder, unspecified: Secondary | ICD-10-CM | POA: Insufficient documentation

## 2013-03-17 DIAGNOSIS — F22 Delusional disorders: Secondary | ICD-10-CM | POA: Insufficient documentation

## 2013-03-17 NOTE — ED Provider Notes (Signed)
CSN: [id number]     Arrival date & time 03/17/13  2308 History   This chart was scribed for Dione Booze, MD, by Yevette Edwards, ED Scribe. This patient was seen in room APA16A/APA16A and the patient's care was started at 11:24 PM.  First MD Initiated Contact with Patient 03/17/13 2320     Chief Complaint  Patient presents with  . Medication Reaction    The history is provided by the patient. The history is limited by the condition of the patient. No language interpreter was used.   HPI Comments: [name] is a 57 y.o. male, with a h/o hallucinations and bipolar, who presents to the Emergency Department complaining of complications associated with a new medication, Geodon, which he was recently prescribed. The pt states that he is experiencing hallucinations and paranoia. The pt denies any SI or HI. As examples, he states that his tools and dog have disappeared and then reappeared. Additionally, he also states that people are accusing him of stealing and of "messing" with his renter's wife. He states that Resporal normally helps him with hallucinations, but the Resporal has not helped with these recent episodes. The pt reports that he is an alcoholic; he states that he has drunk beer several times in the past few weeks but he denies drinking liquor. He also denies using any illicit drugs. The pt was recently discharged from behavior health in [place], and he is supposed to go to [facility]. He is a non-smoker.   History reviewed. No pertinent past medical history. Past Surgical History  Procedure Laterality Date  . Neck surgery     No family history on file. History  Substance Use Topics  . Smoking status: Never Smoker   . Smokeless tobacco: Not on file  . Alcohol Use: No     Comment: daily    Review of Systems  Constitutional: Negative for fever and chills.  Psychiatric/Behavioral: Positive for hallucinations. Negative for suicidal ideas.  All other systems reviewed and are  negative.    Allergies  Review of patient's allergies indicates no known allergies.  Home Medications   Current Outpatient Rx  Name  Route  Sig  Dispense  Refill  . divalproex (DEPAKOTE ER) 500 MG 24 hr tablet   Oral   Take 1 tablet (500 mg total) by mouth 2 (two) times daily.   60 tablet   0   . hydrOXYzine (ATARAX/VISTARIL) 25 MG tablet   Oral   Take 1 tablet (25 mg total) by mouth every 6 (six) hours as needed for anxiety.   30 tablet   0   . traZODone (DESYREL) 100 MG tablet   Oral   Take 1 tablet (100 mg total) by mouth at bedtime and may repeat dose one time if needed.   60 tablet   0   . ziprasidone (GEODON) 80 MG capsule   Oral   Take 1 capsule (80 mg total) by mouth daily after supper.   30 capsule   0    Triage Vitals: BP 124/74  Pulse 90  Temp(Src) 97.9 F (36.6 C) (Oral)  Resp 20  Ht 5\' 8"  (1.727 m)  Wt 141 lb (63.957 kg)  BMI 21.44 kg/m2  SpO2 98%  Physical Exam  Nursing note and vitals reviewed. Constitutional: He is oriented to person, place, and time. He appears well-developed and well-nourished. No distress.  HENT:  Head: Normocephalic and atraumatic.  Eyes: EOM are normal.  Neck: Neck supple. No tracheal deviation present.  Cardiovascular: Normal rate, regular rhythm and normal heart sounds.   Pulmonary/Chest: Effort normal and breath sounds normal. No respiratory distress.  Abdominal: Bowel sounds are normal. There is no tenderness.  Musculoskeletal: Normal range of motion.  Neurological: He is alert and oriented to person, place, and time.  Skin: Skin is warm and dry.  Psychiatric:  Animated demeanor.     ED Course  Procedures (including critical care time)  DIAGNOSTIC STUDIES: Oxygen Saturation is 98% on room air, normal by my interpretation.    COORDINATION OF CARE:  11:32 PM- Discussed treatment plan with patient, and the patient agreed to the plan.   Labs Review Results for orders placed during the hospital encounter of  03/17/13  CBC WITH DIFFERENTIAL      Result Value Range   WBC 5.7  4.0 - 10.5 K/uL   RBC 4.03 (*) 4.22 - 5.81 MIL/uL   Hemoglobin 12.9 (*) 13.0 - 17.0 g/dL   HCT 16.1 (*) 09.6 - 04.5 %   MCV 90.6  78.0 - 100.0 fL   MCH 32.0  26.0 - 34.0 pg   MCHC 35.3  30.0 - 36.0 g/dL   RDW 40.9  81.1 - 91.4 %   Platelets 301  150 - 400 K/uL   Neutrophils Relative % 65  43 - 77 %   Neutro Abs 3.7  1.7 - 7.7 K/uL   Lymphocytes Relative 19  12 - 46 %   Lymphs Abs 1.1  0.7 - 4.0 K/uL   Monocytes Relative 13 (*) 3 - 12 %   Monocytes Absolute 0.7  0.1 - 1.0 K/uL   Eosinophils Relative 2  0 - 5 %   Eosinophils Absolute 0.1  0.0 - 0.7 K/uL   Basophils Relative 1  0 - 1 %   Basophils Absolute 0.1  0.0 - 0.1 K/uL  COMPREHENSIVE METABOLIC PANEL      Result Value Range   Sodium 130 (*) 135 - 145 mEq/L   Potassium 4.1  3.5 - 5.1 mEq/L   Chloride 94 (*) 96 - 112 mEq/L   CO2 24  19 - 32 mEq/L   Glucose, Bld 84  70 - 99 mg/dL   BUN 9  6 - 23 mg/dL   Creatinine, Ser 7.82  0.50 - 1.35 mg/dL   Calcium 9.1  8.4 - 95.6 mg/dL   Total Protein 7.5  6.0 - 8.3 g/dL   Albumin 3.8  3.5 - 5.2 g/dL   AST 47 (*) 0 - 37 U/L   ALT 30  0 - 53 U/L   Alkaline Phosphatase 83  39 - 117 U/L   Total Bilirubin 0.2 (*) 0.3 - 1.2 mg/dL   GFR calc non Af Amer >90  >90 mL/min   GFR calc Af Amer >90  >90 mL/min  ETHANOL      Result Value Range   Alcohol, Ethyl (B) 29 (*) 0 - 11 mg/dL  VALPROIC ACID LEVEL      Result Value Range   Valproic Acid Lvl <10.0 (*) 50.0 - 100.0 ug/mL  URINE RAPID DRUG SCREEN (HOSP PERFORMED)      Result Value Range   Opiates NONE DETECTED  NONE DETECTED   Cocaine NONE DETECTED  NONE DETECTED   Benzodiazepines NONE DETECTED  NONE DETECTED   Amphetamines NONE DETECTED  NONE DETECTED   Tetrahydrocannabinol NONE DETECTED  NONE DETECTED   Barbiturates NONE DETECTED  NONE DETECTED    MDM   1. Bipolar disease, manic  Bipolar disorder with hallucinations and some paranoid ideation. On review of  old records, these symptoms have been present during his recent hospitalization at [facility]. Consultation will be obtained with TTS to see if he needs readmission or if medications need to be adjusted.  TTS agrees that he has paranoid ideation but he is without homicidal or suicidal ideation and is not an immediate threat to self or others. He had missed his appointment with [facility], and they recommended he followup with them for appropriate adjustment of his medications as an outpatient.  I personally performed the services described in this documentation, which was scribed in my presence. The recorded information has been reviewed and is accurate.     Dione Booze, MD 03/18/13 [phone]

## 2013-03-17 NOTE — ED Notes (Signed)
Pt states he was just dc'd from [facility] in [place] on the 31st, states he was prescribed several new meds including Geodon, but states he cannot take the geodon because it makes him "feel crazy"  Pt denies SI or HI

## 2013-03-18 ENCOUNTER — Encounter (HOSPITAL_COMMUNITY): Payer: Self-pay | Admitting: Emergency Medicine

## 2013-03-18 LAB — CBC WITH DIFFERENTIAL/PLATELET
Basophils Absolute: 0.1 10*3/uL (ref 0.0–0.1)
Eosinophils Absolute: 0.1 10*3/uL (ref 0.0–0.7)
Eosinophils Relative: 2 % (ref 0–5)
HCT: 36.5 % — ABNORMAL LOW (ref 39.0–52.0)
Lymphocytes Relative: 19 % (ref 12–46)
Lymphs Abs: 1.1 10*3/uL (ref 0.7–4.0)
MCV: 90.6 fL (ref 78.0–100.0)
Neutro Abs: 3.7 10*3/uL (ref 1.7–7.7)
Neutrophils Relative %: 65 % (ref 43–77)
Platelets: 301 10*3/uL (ref 150–400)
RBC: 4.03 MIL/uL — ABNORMAL LOW (ref 4.22–5.81)
RDW: 12.6 % (ref 11.5–15.5)
WBC: 5.7 10*3/uL (ref 4.0–10.5)

## 2013-03-18 LAB — VALPROIC ACID LEVEL: Valproic Acid Lvl: <10 ug/mL — ABNORMAL LOW (ref 50.0–100.0)

## 2013-03-18 LAB — COMPREHENSIVE METABOLIC PANEL
ALT: 30 U/L (ref 0–53)
AST: 47 U/L — ABNORMAL HIGH (ref 0–37)
Alkaline Phosphatase: 83 U/L (ref 39–117)
CO2: 24 mEq/L (ref 19–32)
Calcium: 9.1 mg/dL (ref 8.4–10.5)
GFR calc Af Amer: >90 mL/min (ref >90)
Potassium: 4.1 mEq/L (ref 3.5–5.1)
Sodium: 130 mEq/L — ABNORMAL LOW (ref 135–145)
Total Protein: 7.5 g/dL (ref 6.0–8.3)

## 2013-03-18 LAB — RAPID URINE DRUG SCREEN, HOSP PERFORMED
Amphetamines: NOT DETECTED
Barbiturates: NOT DETECTED
Benzodiazepines: NOT DETECTED
Cocaine: NOT DETECTED
Opiates: NOT DETECTED
Tetrahydrocannabinol: NOT DETECTED

## 2013-03-18 NOTE — ED Notes (Signed)
Plan for discharge: pt given phone number and address for local [facility]. Also given phone number for local bus service. Pt states he will call [facility] in am to see about getting his prescriptions filled

## 2013-03-18 NOTE — ED Notes (Signed)
Same. intermittently watching TV. Wants to go home now. States his "panic attack" is over and her needs to go home. Has reluctantly agreed to stay for telepsych consult.

## 2013-03-18 NOTE — ED Notes (Signed)
Patient is resting comfortably. 
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
## 2013-03-18 NOTE — Discharge Instructions (Signed)
You were given medications when you were discharged from [facility]. Please take that medication as it was prescribed, even if you think it is not working. You need to followup with [facility], where they can make sure that you have enough medication and adjust it if needed.

## 2013-03-18 NOTE — ED Notes (Signed)
telepsych consult in progress

## 2013-03-22 ENCOUNTER — Encounter (HOSPITAL_COMMUNITY): Payer: Self-pay | Admitting: Emergency Medicine

## 2013-03-22 ENCOUNTER — Emergency Department (HOSPITAL_COMMUNITY)
Admission: EM | Admit: 2013-03-22 | Discharge: 2013-03-23 | Disposition: A | Discharge status: Home / Self Care | Payer: Self-pay | Attending: Emergency Medicine | Admitting: Emergency Medicine

## 2013-03-22 DIAGNOSIS — IMO0002 Reserved for concepts with insufficient information to code with codable children: Secondary | ICD-10-CM | POA: Insufficient documentation

## 2013-03-22 DIAGNOSIS — F319 Bipolar disorder, unspecified: Secondary | ICD-10-CM | POA: Insufficient documentation

## 2013-03-22 DIAGNOSIS — R451 Restlessness and agitation: Secondary | ICD-10-CM

## 2013-03-22 DIAGNOSIS — Z79899 Other long term (current) drug therapy: Secondary | ICD-10-CM | POA: Insufficient documentation

## 2013-03-22 MED ORDER — DIVALPROEX SODIUM ER 500 MG PO TB24
500.0000 mg | ORAL_TABLET | Freq: Every day | ORAL | Status: DC
  Administered 2013-03-23: 500 mg via ORAL

## 2013-03-22 MED ORDER — HYDROXYZINE HCL 25 MG PO TABS
25.0000 mg | ORAL_TABLET | Freq: Once | ORAL | Status: AC
  Administered 2013-03-23: 25 mg via ORAL
  Filled 2013-03-22: qty 1

## 2013-03-22 MED ORDER — DIVALPROEX SODIUM ER 500 MG PO TB24: 500.0000 mg | ORAL_TABLET | Freq: Every day | ORAL | Status: DC

## 2013-03-22 MED ORDER — HYDROXYZINE HCL 25 MG PO TABS: 25.0000 mg | ORAL_TABLET | Freq: Four times a day (QID) | ORAL | Status: DC

## 2013-03-22 MED ORDER — TRAZODONE HCL 100 MG PO TABS: 100.0000 mg | ORAL_TABLET | Freq: Every day | ORAL | Status: DC

## 2013-03-22 NOTE — ED Provider Notes (Signed)
CSN: [id number]     Arrival date & time 03/22/13  2242 History  This chart was scribed for Vida Roller, MD by Bennett Scrape, ED Scribe. This patient was seen in room APA06/APA06 and the patient's care was started at 11:11 PM.     Chief Complaint  Patient presents with  . Panic Attack    The history is provided by the patient. No language interpreter was used.    HPI Comments: [name] is a 57 y.o. male with a h/o hallucinations and bipolar disorder who presents to the Emergency Department complaining of anxiety due to being out of his medications and having no way to afford them. He has current prescriptions for Geodon, Trazadone, Depakote and Xanax. He endorses that he is not taking the Geodon because of the medication reaction which he was seen for on 03/17/13. He states that he takes the Trazodone intermittently PRN. He has been out of Depakote and Xanax "for a while". He states that he is not on Medicaid currently and has no job, so he is unable to pay for his medications. He has no other complaints currently.  He is not suicidal at this time.   History reviewed. No pertinent past medical history. Past Surgical History  Procedure Laterality Date  . Neck surgery     History reviewed. No pertinent family history. History  Substance Use Topics  . Smoking status: Never Smoker   . Smokeless tobacco: Not on file  . Alcohol Use: No     Comment: daily    Review of Systems  All other systems reviewed and are negative.    Allergies  Review of patient's allergies indicates no known allergies.  Home Medications   Current Outpatient Rx  Name  Route  Sig  Dispense  Refill  . divalproex (DEPAKOTE ER) 500 MG 24 hr tablet   Oral   Take 1 tablet (500 mg total) by mouth 2 (two) times daily.   60 tablet   0   . divalproex (DEPAKOTE ER) 500 MG 24 hr tablet   Oral   Take 1 tablet (500 mg total) by mouth daily.   30 tablet   1   . hydrOXYzine (ATARAX/VISTARIL) 25 MG  tablet   Oral   Take 1 tablet (25 mg total) by mouth every 6 (six) hours as needed for anxiety.   30 tablet   0   . hydrOXYzine (ATARAX/VISTARIL) 25 MG tablet   Oral   Take 1 tablet (25 mg total) by mouth every 6 (six) hours.   12 tablet   0   . traZODone (DESYREL) 100 MG tablet   Oral   Take 1 tablet (100 mg total) by mouth at bedtime and may repeat dose one time if needed.   60 tablet   0   . traZODone (DESYREL) 100 MG tablet   Oral   Take 1 tablet (100 mg total) by mouth at bedtime.   30 tablet   1   . ziprasidone (GEODON) 80 MG capsule   Oral   Take 1 capsule (80 mg total) by mouth daily after supper.   30 capsule   0    Triage Vitals: BP 106/66  Pulse 70  Temp(Src) 98.6 F (37 C) (Oral)  Resp 16  Ht 5\' 8"  (1.727 m)  Wt 109 lb 5 oz (49.584 kg)  BMI 16.62 kg/m2  SpO2 100%  Physical Exam  Nursing note and vitals reviewed. Constitutional: He is oriented to person, place,  and time. He appears well-developed and well-nourished. No distress.  HENT:  Head: Normocephalic and atraumatic.  Eyes: Conjunctivae are normal. Right eye exhibits no discharge. Left eye exhibits no discharge. No scleral icterus.  Cardiovascular: Normal rate.   Pulmonary/Chest: Effort normal. No respiratory distress.  Musculoskeletal: Normal range of motion. He exhibits no edema.  Neurological: He is alert and oriented to person, place, and time.  Skin: Skin is warm and dry.  Psychiatric:  Ambulatory - excited demeanor - no suicidal thoughts, no hallucinations - not responding to internal stimuli    ED Course  Procedures (including critical care time)  DIAGNOSTIC STUDIES: Oxygen Saturation is 100% on room air, normal by my interpretation.    COORDINATION OF CARE: 11:16 PM-Advised pt that he will have to f/u with resources in order to find help with securing a job to pay for medications.   Labs Review Labs Reviewed - No data to display Imaging Review No results found.  EKG  Interpretation   None       MDM   1. Agitation    The patient appears mildly agitated, he has been given his medications and is very happy with this, again he has some social difficulties getting his medications but does not appear to be a danger to himself or others nor is he responding to internal stimuli and can be safely discharged home. I have recommended that he follow[facility] at [facility]  I personally performed the services described in this documentation, which was scribed in my presence. The recorded information has been reviewed and is accurate.       Vida Roller, MD 03/23/13 [phone]

## 2013-03-22 NOTE — ED Notes (Signed)
Seen here last week, has been unable to fill his rx or be seen @ [facility] secondary to no money/no transportation. Feeling increasing panic, increasingly manic

## 2013-03-23 MED ORDER — DIVALPROEX SODIUM ER 250 MG PO TB24
ORAL_TABLET | ORAL | Status: AC
  Filled 2013-03-23: qty 2

## 2013-03-23 NOTE — ED Notes (Signed)
Known pt to this nurse. Once again I have given him referral info for pharmacies, and [facility]. Encouraged pt to call [facility] in am.

## 2013-03-26 ENCOUNTER — Encounter (HOSPITAL_COMMUNITY): Payer: Self-pay | Admitting: Emergency Medicine

## 2013-03-26 ENCOUNTER — Emergency Department (HOSPITAL_COMMUNITY)
Admission: EM | Admit: 2013-03-26 | Discharge: 2013-03-26 | Disposition: A | Discharge status: Home / Self Care | Payer: Self-pay | Attending: Emergency Medicine | Admitting: Emergency Medicine

## 2013-03-26 ENCOUNTER — Emergency Department (HOSPITAL_COMMUNITY)
Admission: EM | Admit: 2013-03-26 | Discharge: 2013-03-27 | Disposition: A | Discharge status: Home / Self Care | Payer: Self-pay | Attending: Emergency Medicine | Admitting: Emergency Medicine

## 2013-03-26 DIAGNOSIS — Z91199 Patient's noncompliance with other medical treatment and regimen due to unspecified reason: Secondary | ICD-10-CM | POA: Insufficient documentation

## 2013-03-26 DIAGNOSIS — Z5900 Homelessness unspecified: Secondary | ICD-10-CM | POA: Insufficient documentation

## 2013-03-26 DIAGNOSIS — Z79899 Other long term (current) drug therapy: Secondary | ICD-10-CM | POA: Insufficient documentation

## 2013-03-26 DIAGNOSIS — Z9119 Patient's noncompliance with other medical treatment and regimen: Secondary | ICD-10-CM | POA: Insufficient documentation

## 2013-03-26 DIAGNOSIS — Z9114 Patient's other noncompliance with medication regimen: Secondary | ICD-10-CM

## 2013-03-26 DIAGNOSIS — F319 Bipolar disorder, unspecified: Secondary | ICD-10-CM | POA: Insufficient documentation

## 2013-03-26 DIAGNOSIS — Z760 Encounter for issue of repeat prescription: Secondary | ICD-10-CM | POA: Insufficient documentation

## 2013-03-26 DIAGNOSIS — F22 Delusional disorders: Secondary | ICD-10-CM | POA: Insufficient documentation

## 2013-03-26 DIAGNOSIS — F1010 Alcohol abuse, uncomplicated: Secondary | ICD-10-CM | POA: Insufficient documentation

## 2013-03-26 HISTORY — DX: Hallucinations, unspecified: R44.3

## 2013-03-26 HISTORY — DX: Patient's other noncompliance with medication regimen: Z91.14

## 2013-03-26 HISTORY — DX: Patient's other noncompliance with medication regimen for other reason: Z91.148

## 2013-03-26 HISTORY — DX: Obsessive-compulsive disorder, unspecified: F42.9

## 2013-03-26 HISTORY — DX: Homelessness: Z59.0

## 2013-03-26 HISTORY — DX: Alcohol abuse, uncomplicated: F10.10

## 2013-03-26 HISTORY — DX: Bipolar disorder, unspecified: F31.9

## 2013-03-26 HISTORY — DX: Homelessness unspecified: Z59.00

## 2013-03-26 HISTORY — DX: Delusional disorders: F22

## 2013-03-26 LAB — CBC WITH DIFFERENTIAL/PLATELET
Basophils Absolute: 0.1 10*3/uL (ref 0.0–0.1)
Basophils Relative: 1 % (ref 0–1)
Eosinophils Absolute: 0.2 10*3/uL (ref 0.0–0.7)
Hemoglobin: 13.3 g/dL (ref 13.0–17.0)
Lymphs Abs: 1.2 10*3/uL (ref 0.7–4.0)
MCH: 32.6 pg (ref 26.0–34.0)
MCHC: 35.5 g/dL (ref 30.0–36.0)
MCV: 91.9 fL (ref 78.0–100.0)
Monocytes Relative: 8 % (ref 3–12)
Neutrophils Relative %: 70 % (ref 43–77)
Platelets: 291 10*3/uL (ref 150–400)
RDW: 13 % (ref 11.5–15.5)

## 2013-03-26 LAB — BASIC METABOLIC PANEL
BUN: 10 mg/dL (ref 6–23)
CO2: 26 mEq/L (ref 19–32)
Calcium: 8.8 mg/dL (ref 8.4–10.5)
Chloride: 94 mEq/L — ABNORMAL LOW (ref 96–112)
Creatinine, Ser: 0.88 mg/dL (ref 0.50–1.35)
GFR calc Af Amer: >90 mL/min (ref >90)
GFR calc non Af Amer: >90 mL/min (ref >90)
Glucose, Bld: 104 mg/dL — ABNORMAL HIGH (ref 70–99)
Potassium: 4.3 mEq/L (ref 3.5–5.1)
Sodium: 131 mEq/L — ABNORMAL LOW (ref 135–145)

## 2013-03-26 LAB — RAPID URINE DRUG SCREEN, HOSP PERFORMED
Barbiturates: NOT DETECTED
Cocaine: NOT DETECTED
Opiates: NOT DETECTED

## 2013-03-26 LAB — ETHANOL: Alcohol, Ethyl (B): 130 mg/dL — ABNORMAL HIGH (ref 0–11)

## 2013-03-26 NOTE — ED Notes (Signed)
Patient states that he can't find work, so he doesn't have money to buy the medication. Needs help. States that he can't even get coffee at the drive through in [place]. States that he can't even use the phone at his neighbor's without getting locked up.

## 2013-03-26 NOTE — ED Notes (Signed)
Pt in by ems for "paranoia"  Pt is apparently homeless, has no resources.

## 2013-03-26 NOTE — ED Provider Notes (Signed)
CSN: [id number]     Arrival date & time 03/26/13  2021 History   First MD Initiated Contact with Patient 03/26/13 2040     Chief Complaint  Patient presents with  . Medical Clearance    HPI Pt was seen at 2130. Per EMS and pt report, c/o gradual onset and worsening of constant "paranoia" for the past sevearl weeks. Pt states he has not been taking his psych meds "for a while" because he "can't afford them." Pt has been already evaluated in the ED twice in the past 2 weeks for same. States he also has not been following up as recommended because he "can't get anywhere" because he "doesn't have transportation or money for it."  Currently denies SI, HI, or hallucinations.    Past Medical History  Diagnosis Date  . OCD (obsessive compulsive disorder)   . Bipolar disorder   . Hx of medication noncompliance   . Alcohol abuse   . Hallucinations   . Paranoid ideation   . Homeless    Past Surgical History  Procedure Laterality Date  . Neck surgery      History  Substance Use Topics  . Smoking status: Never Smoker   . Smokeless tobacco: Not on file  . Alcohol Use: Yes     Comment: daily    Review of Systems ROS: Statement: All systems negative except as marked or noted in the HPI; Constitutional: Negative for fever and chills. ; ; Eyes: Negative for eye pain, redness and discharge. ; ; ENMT: Negative for ear pain, hoarseness, nasal congestion, sinus pressure and sore throat. ; ; Cardiovascular: Negative for chest pain, palpitations, diaphoresis, dyspnea and peripheral edema. ; ; Respiratory: Negative for cough, wheezing and stridor. ; ; Gastrointestinal: Negative for nausea, vomiting, diarrhea, abdominal pain, blood in stool, hematemesis, jaundice and rectal bleeding. . ; ; Genitourinary: Negative for dysuria, flank pain and hematuria. ; ; Musculoskeletal: Negative for back pain and neck pain. Negative for swelling and trauma.; ; Skin: Negative for pruritus, rash, abrasions, blisters,  bruising and skin lesion.; ; Neuro: Negative for headache, lightheadedness and neck stiffness. Negative for weakness, altered level of consciousness , altered mental status, extremity weakness, paresthesias, involuntary movement, seizure and syncope.; Psych:  +"paranoid." No SI, no SA, no HI.     Allergies  Review of patient's allergies indicates no known allergies.  Home Medications   Current Outpatient Rx  Name  Route  Sig  Dispense  Refill  . divalproex (DEPAKOTE) 500 MG DR tablet   Oral   Take 500 mg by mouth 2 (two) times daily.         . hydrOXYzine (ATARAX/VISTARIL) 25 MG tablet   Oral   Take 1 tablet (25 mg total) by mouth every 6 (six) hours.   12 tablet   0   . ibuprofen (ADVIL,MOTRIN) 200 MG tablet   Oral   Take 600 mg by mouth every 6 (six) hours as needed.         . traZODone (DESYREL) 100 MG tablet   Oral   Take 1 tablet (100 mg total) by mouth at bedtime.   30 tablet   1    BP 130/70  Pulse 94  Temp(Src) 98.8 F (37.1 C) (Oral)  Resp 18  Ht 5\' 8"  (1.727 m)  Wt 135 lb (61.236 kg)  BMI 20.53 kg/m2  SpO2 99% Physical Exam 2135: Physical examination:  Nursing notes reviewed; Vital signs and O2 SAT reviewed;  Constitutional: Well developed, Well nourished,  Well hydrated, In no acute distress; Head:  Normocephalic, atraumatic; Eyes: EOMI, PERRL, No scleral icterus; ENMT: Mouth and pharynx normal, Mucous membranes moist; Neck: Supple, Full range of motion, No lymphadenopathy; Cardiovascular: Regular rate and rhythm, No gallop; Respiratory: Breath sounds clear & equal bilaterally, No wheezes.  Speaking full sentences with ease, Normal respiratory effort/excursion; Chest: Nontender, Movement normal; Abdomen: Soft, Nondistended, Normal bowel sounds;; Extremities: Pulses normal, No deformity, No edema.; Neuro: AA&Ox3, Major CN grossly intact. Speech clear. Climbs on and off stretcher easily by himself. Gait steady.  No gross focal motor or sensory deficits in  extremities.; Skin: Color normal, Warm, Dry.; Psych:  Boisterous. Laughing and talking loudly with ED staff while walking around the ED asking for food. Does not appear responding to internal stimuli.     ED Course  Procedures   EKG Interpretation   None       MDM  MDM Reviewed: previous chart, nursing note and vitals Reviewed previous: labs Interpretation: labs   Results for orders placed during the hospital encounter of 03/26/13  CBC WITH DIFFERENTIAL      Result Value Range   WBC 6.4  4.0 - 10.5 K/uL   RBC 4.08 (*) 4.22 - 5.81 MIL/uL   Hemoglobin 13.3  13.0 - 17.0 g/dL   HCT 13.0 (*) 86.5 - 78.4 %   MCV 91.9  78.0 - 100.0 fL   MCH 32.6  26.0 - 34.0 pg   MCHC 35.5  30.0 - 36.0 g/dL   RDW 69.6  29.5 - 28.4 %   Platelets 291  150 - 400 K/uL   Neutrophils Relative % 70  43 - 77 %   Neutro Abs 4.5  1.7 - 7.7 K/uL   Lymphocytes Relative 19  12 - 46 %   Lymphs Abs 1.2  0.7 - 4.0 K/uL   Monocytes Relative 8  3 - 12 %   Monocytes Absolute 0.5  0.1 - 1.0 K/uL   Eosinophils Relative 2  0 - 5 %   Eosinophils Absolute 0.2  0.0 - 0.7 K/uL   Basophils Relative 1  0 - 1 %   Basophils Absolute 0.1  0.0 - 0.1 K/uL  BASIC METABOLIC PANEL      Result Value Range   Sodium 131 (*) 135 - 145 mEq/L   Potassium 4.3  3.5 - 5.1 mEq/L   Chloride 94 (*) 96 - 112 mEq/L   CO2 26  19 - 32 mEq/L   Glucose, Bld 104 (*) 70 - 99 mg/dL   BUN 10  6 - 23 mg/dL   Creatinine, Ser 1.32  0.50 - 1.35 mg/dL   Calcium 8.8  8.4 - 44.0 mg/dL   GFR calc non Af Amer >90  >90 mL/min   GFR calc Af Amer >90  >90 mL/min  ETHANOL      Result Value Range   Alcohol, Ethyl (B) 130 (*) 0 - 11 mg/dL  URINE RAPID DRUG SCREEN (HOSP PERFORMED)      Result Value Range   Opiates NONE DETECTED  NONE DETECTED   Cocaine NONE DETECTED  NONE DETECTED   Benzodiazepines NONE DETECTED  NONE DETECTED   Amphetamines NONE DETECTED  NONE DETECTED   Tetrahydrocannabinol NONE DETECTED  NONE DETECTED   Barbiturates NONE DETECTED   NONE DETECTED     2350:  Pending TSS eval. Holding orders written.        Laray Anger, DO 03/26/13 2351

## 2013-03-27 ENCOUNTER — Emergency Department (HOSPITAL_COMMUNITY): Payer: Self-pay

## 2013-03-27 MED ORDER — COLCHICINE 0.6 MG PO TABS
0.6000 mg | ORAL_TABLET | Freq: Once | ORAL | Status: AC
  Administered 2013-03-27: 0.6 mg via ORAL
  Filled 2013-03-27: qty 1

## 2013-03-27 MED ORDER — LORAZEPAM 1 MG PO TABS
1.0000 mg | ORAL_TABLET | Freq: Once | ORAL | Status: AC
  Administered 2013-03-27: 1 mg via ORAL
  Filled 2013-03-27: qty 1

## 2013-03-27 MED ORDER — ZOLPIDEM TARTRATE 5 MG PO TABS
10.0000 mg | ORAL_TABLET | Freq: Once | ORAL | Status: AC
  Administered 2013-03-27: 10 mg via ORAL
  Filled 2013-03-27: qty 2

## 2013-03-27 NOTE — BH Assessment (Addendum)
[facility] Assessment Progress Note Update:  The following facilities were contacted and referrals faxed where beds available in addition to facilities contacted by Marcelino Duster, MHT:  [facility]-left message @ [phone];  [facility] called @ [phone] - beds available, referral faxed for review  [facility]-no beds per [facility] @ [phone]  [facility]-beds per Lupita Leash @ [phone] (male), no geri beds  [facility]-Per Romana Juniper, fax referrals @ [phone]-referral faxed for review  [facility]-no beds per [facility] @ [phone]  [facility]-adult beds per [facility] @ [phone]-referral faxed for review  [facility]-left message @ [phone]  [facility] referrals per Pam @ [phone], referral faxed for review  [facility]-beds per Anaj[facility] @ [phone], referral faxed for review

## 2013-03-27 NOTE — ED Provider Notes (Signed)
Chest x-ray and EKG were requested by behavioral health, see EKG note below. Patient is asymptomatic of any cardiopulmonary complaints  ED ECG REPORT  I personally interpreted this EKG   Date: 03/27/2013   Rate: 68  Rhythm: normal sinus rhythm  QRS Axis: normal  Intervals: normal  ST/T Wave abnormalities: normal  Conduction Disutrbances:none  Narrative Interpretation:   Old EKG Reviewed: none available   Vida Roller, MD 03/27/13 0630

## 2013-03-27 NOTE — ED Notes (Signed)
Two belongings bags given to police for transport with patient to [facility]
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
## 2013-03-27 NOTE — BH Assessment (Signed)
Spoke with Dr. Eber Hong. Per Dr. Hyacinth Meeker, Pt has been in the ED multiple times recently reporting paranoia and agitation. He is homeless, unemployed, has no resources and is unable to afford his medications or follow up with outpatient treatment. Dr. Hyacinth Meeker feels Pt is in a downward spiral and would benefit from inpatient psychiatric treatment. Tele-assessment will be initiated.  Harlin Rain Ria Comment, [facility] Triage Specialist

## 2013-03-27 NOTE — BH Assessment (Signed)
[facility] Assessment Progress Note 03/27/13 - Pt accepted to [facility] to Dr. Loreta Ave per Larita Fife @ [phone] and number for nurse report is [phone].  Pt to be placed under IVC and transported.  EDP Wickline and pt's nurse notified at [facility].

## 2013-03-27 NOTE — ED Notes (Signed)
Patient taken back to hallway 8. Stated he was  Tired of  Living and wanted it to stop living.

## 2013-03-27 NOTE — Progress Notes (Signed)
Underwriter faxed referral to the following hospitals with bed availability: 1)[facility] 3)[facility] 4)[facility] 5)[facility]  Blain Pais, MHT/NS

## 2013-03-27 NOTE — ED Notes (Signed)
Patient states that his feet are hurting from gout. Would like some medication to treat gout. MD notified.

## 2013-03-27 NOTE — BH Assessment (Signed)
Assessment complete. Contacted [name] who confirmed [facility] is at capacity. Consulted with Dr. Eber Hong who agrees Pt meets criteria for inpatient psychiatric treatment. TTS will contact appropriate facilities for bed availability and placement.  Harlin Rain Ria Comment, [facility] Triage Specialist

## 2013-03-31 NOTE — ED Provider Notes (Signed)
CSN: [id number]     Arrival date & time 03/26/13  0246 History   None    Chief Complaint  Patient presents with  . Medication Refill   (Consider location/radiation/quality/duration/timing/severity/associated sxs/prior Treatment) HPI Hx per PT - frustrated that he cant find a job to pay for medications tonight tried to walk thru the drive thru at [facility] was not able to buy coffee, he presents here stating frustration requesting samples of his prescribed medications. No mediacl complaints, no pscyh complaints. He has recent visit, prescriptions in hand, also has outpatient referrals.  Past Medical History  Diagnosis Date  . OCD (obsessive compulsive disorder)   . Bipolar disorder   . Hx of medication noncompliance   . Alcohol abuse   . Hallucinations   . Paranoid ideation   . Homeless    Past Surgical History  Procedure Laterality Date  . Neck surgery     History reviewed. No pertinent family history. History  Substance Use Topics  . Smoking status: Never Smoker   . Smokeless tobacco: Not on file  . Alcohol Use: Yes     Comment: daily    Review of Systems  Constitutional: Negative for fever and chills.  Eyes: Negative for pain.  Respiratory: Negative for shortness of breath.   Cardiovascular: Negative for chest pain.  Gastrointestinal: Negative for abdominal pain.  Genitourinary: Negative for dysuria.  Musculoskeletal: Negative for back pain, neck pain and neck stiffness.  Skin: Negative for rash.  Neurological: Negative for headaches.  Psychiatric/Behavioral: Negative for suicidal ideas and self-injury.  All other systems reviewed and are negative.    Allergies  Review of patient's allergies indicates no known allergies.  Home Medications   Current Outpatient Rx  Name  Route  Sig  Dispense  Refill  . divalproex (DEPAKOTE) 500 MG DR tablet   Oral   Take 500 mg by mouth 2 (two) times daily.         . hydrOXYzine (ATARAX/VISTARIL) 25 MG tablet   Oral  Take 1 tablet (25 mg total) by mouth every 6 (six) hours.   12 tablet   0   . ibuprofen (ADVIL,MOTRIN) 200 MG tablet   Oral   Take 600 mg by mouth every 6 (six) hours as needed.         . traZODone (DESYREL) 100 MG tablet   Oral   Take 1 tablet (100 mg total) by mouth at bedtime.   30 tablet   1    BP 143/79  Pulse 107  Temp(Src) 98.3 F (36.8 C) (Oral)  Resp 20  Ht 5\' 8"  (1.727 m)  Wt 135 lb (61.236 kg)  BMI 20.53 kg/m2  SpO2 100% Physical Exam  Constitutional: He is oriented to person, place, and time. He appears well-developed and well-nourished.  HENT:  Head: Normocephalic and atraumatic.  Eyes: EOM are normal. Pupils are equal, round, and reactive to light.  Neck: Neck supple.  Cardiovascular: Regular rhythm and intact distal pulses.   Pulmonary/Chest: Effort normal. No respiratory distress.  Musculoskeletal: Normal range of motion. He exhibits no edema.  Neurological: He is alert and oriented to person, place, and time.  Skin: Skin is warm and dry.  Psychiatric:  No SI/HI    ED Course  Procedures (including critical care time) Labs Review Labs Reviewed - No data to display Imaging Review No results found.  PT encouraged to use outpatient referrals as provided for assistance with filling medications  MDM   1. Encounter for medication refill  Referrals again provided Old records reviewed - no indication for labs, imaging at this time     Sunnie Nielsen, MD 03/31/13 1158

## 2014-11-23 ENCOUNTER — Emergency Department (HOSPITAL_COMMUNITY): Payer: Self-pay

## 2014-11-23 ENCOUNTER — Observation Stay (HOSPITAL_COMMUNITY)
Admission: EM | Admit: 2014-11-23 | Discharge: 2014-11-24 | Disposition: A | Discharge status: Home / Self Care | Payer: Self-pay | Attending: Internal Medicine | Admitting: Internal Medicine

## 2014-11-23 ENCOUNTER — Observation Stay (HOSPITAL_COMMUNITY): Payer: Self-pay

## 2014-11-23 ENCOUNTER — Encounter (HOSPITAL_COMMUNITY): Payer: Self-pay | Admitting: Emergency Medicine

## 2014-11-23 DIAGNOSIS — F22 Delusional disorders: Secondary | ICD-10-CM | POA: Insufficient documentation

## 2014-11-23 DIAGNOSIS — Z79899 Other long term (current) drug therapy: Secondary | ICD-10-CM | POA: Insufficient documentation

## 2014-11-23 DIAGNOSIS — M799 Soft tissue disorder, unspecified: Secondary | ICD-10-CM | POA: Insufficient documentation

## 2014-11-23 DIAGNOSIS — R471 Dysarthria and anarthria: Secondary | ICD-10-CM | POA: Diagnosis present

## 2014-11-23 DIAGNOSIS — Z590 Homelessness: Secondary | ICD-10-CM | POA: Insufficient documentation

## 2014-11-23 DIAGNOSIS — Z9119 Patient's noncompliance with other medical treatment and regimen: Secondary | ICD-10-CM | POA: Insufficient documentation

## 2014-11-23 DIAGNOSIS — F42 Obsessive-compulsive disorder: Secondary | ICD-10-CM | POA: Insufficient documentation

## 2014-11-23 DIAGNOSIS — R2242 Localized swelling, mass and lump, left lower limb: Principal | ICD-10-CM | POA: Insufficient documentation

## 2014-11-23 DIAGNOSIS — F1020 Alcohol dependence, uncomplicated: Secondary | ICD-10-CM | POA: Diagnosis present

## 2014-11-23 DIAGNOSIS — M7989 Other specified soft tissue disorders: Secondary | ICD-10-CM

## 2014-11-23 DIAGNOSIS — M25472 Effusion, left ankle: Secondary | ICD-10-CM

## 2014-11-23 DIAGNOSIS — F319 Bipolar disorder, unspecified: Secondary | ICD-10-CM | POA: Insufficient documentation

## 2014-11-23 LAB — COMPREHENSIVE METABOLIC PANEL
ALBUMIN: 3.7 g/dL (ref 3.5–5.0)
ALT: 32 U/L (ref 17–63)
AST: 45 U/L — ABNORMAL HIGH (ref 15–41)
Alkaline Phosphatase: 61 U/L (ref 38–126)
Anion gap: 9 (ref 5–15)
BUN: 13 mg/dL (ref 6–20)
CHLORIDE: 96 mmol/L — AB (ref 101–111)
CO2: 25 mmol/L (ref 22–32)
CREATININE: 0.74 mg/dL (ref 0.61–1.24)
Calcium: 8.6 mg/dL — ABNORMAL LOW (ref 8.9–10.3)
GFR calc non Af Amer: >60 mL/min (ref >60)
Glucose, Bld: 158 mg/dL — ABNORMAL HIGH (ref 65–99)
Potassium: 3.7 mmol/L (ref 3.5–5.1)
SODIUM: 130 mmol/L — AB (ref 135–145)
Total Bilirubin: 0.7 mg/dL (ref 0.3–1.2)
Total Protein: 7.3 g/dL (ref 6.5–8.1)

## 2014-11-23 LAB — CBC
HCT: 36.6 % — ABNORMAL LOW (ref 39.0–52.0)
Hemoglobin: 12.8 g/dL — ABNORMAL LOW (ref 13.0–17.0)
MCH: 31.3 pg (ref 26.0–34.0)
MCHC: 35 g/dL (ref 30.0–36.0)
MCV: 89.5 fL (ref 78.0–100.0)
Platelets: 264 10*3/uL (ref 150–400)
RBC: 4.09 MIL/uL — ABNORMAL LOW (ref 4.22–5.81)
RDW: 13.7 % (ref 11.5–15.5)
WBC: 6 10*3/uL (ref 4.0–10.5)

## 2014-11-23 LAB — URINALYSIS, ROUTINE W REFLEX MICROSCOPIC
Bilirubin Urine: NEGATIVE
Glucose, UA: NEGATIVE mg/dL
Hgb urine dipstick: NEGATIVE
Ketones, ur: NEGATIVE mg/dL
LEUKOCYTES UA: NEGATIVE
NITRITE: NEGATIVE
Protein, ur: NEGATIVE mg/dL
Specific Gravity, Urine: 1.015 (ref 1.005–1.030)
UROBILINOGEN UA: 0.2 mg/dL (ref 0.0–1.0)
pH: 6 (ref 5.0–8.0)

## 2014-11-23 LAB — I-STAT TROPONIN, ED: Troponin i, poc: 0 ng/mL (ref 0.00–0.08)

## 2014-11-23 LAB — PROTIME-INR
INR: 1.06 (ref 0.00–1.49)
Prothrombin Time: 14 seconds (ref 11.6–15.2)

## 2014-11-23 LAB — I-STAT CHEM 8, ED
BUN: 12 mg/dL (ref 6–20)
Calcium, Ion: 1.13 mmol/L (ref 1.12–1.23)
Chloride: 95 mmol/L — ABNORMAL LOW (ref 101–111)
Creatinine, Ser: 0.6 mg/dL — ABNORMAL LOW (ref 0.61–1.24)
Glucose, Bld: 160 mg/dL — ABNORMAL HIGH (ref 65–99)
HCT: 41 % (ref 39.0–52.0)
Hemoglobin: 13.9 g/dL (ref 13.0–17.0)
POTASSIUM: 3.6 mmol/L (ref 3.5–5.1)
Sodium: 131 mmol/L — ABNORMAL LOW (ref 135–145)
TCO2: 24 mmol/L (ref 0–100)

## 2014-11-23 LAB — RAPID URINE DRUG SCREEN, HOSP PERFORMED
Amphetamines: NOT DETECTED
Barbiturates: NOT DETECTED
Benzodiazepines: NOT DETECTED
Cocaine: NOT DETECTED
Opiates: NOT DETECTED
Tetrahydrocannabinol: NOT DETECTED

## 2014-11-23 LAB — CBG MONITORING, ED: GLUCOSE-CAPILLARY: 157 mg/dL — AB (ref 65–99)

## 2014-11-23 LAB — ETHANOL: Alcohol, Ethyl (B): <5 mg/dL (ref <5)

## 2014-11-23 LAB — APTT: aPTT: 29 seconds (ref 24–37)

## 2014-11-23 MED ORDER — ALPRAZOLAM 0.5 MG PO TABS
0.5000 mg | ORAL_TABLET | Freq: Three times a day (TID) | ORAL | Status: DC | PRN
  Administered 2014-11-23 – 2014-11-24 (×2): 0.5 mg via ORAL
  Filled 2014-11-23 (×2): qty 1

## 2014-11-23 MED ORDER — ONDANSETRON HCL 4 MG PO TABS: 4.0000 mg | ORAL_TABLET | Freq: Four times a day (QID) | ORAL | Status: DC | PRN

## 2014-11-23 MED ORDER — SODIUM CHLORIDE 0.9 % IV SOLN
INTRAVENOUS | Status: DC
  Administered 2014-11-23: 20:00:00 via INTRAVENOUS

## 2014-11-23 MED ORDER — THIAMINE HCL 100 MG/ML IJ SOLN
100.0000 mg | Freq: Every day | INTRAMUSCULAR | Status: DC
  Administered 2014-11-23 – 2014-11-24 (×2): 100 mg via INTRAVENOUS
  Filled 2014-11-23 (×2): qty 2

## 2014-11-23 MED ORDER — ENOXAPARIN SODIUM 40 MG/0.4ML ~~LOC~~ SOLN
40.0000 mg | SUBCUTANEOUS | Status: DC
  Administered 2014-11-23: 40 mg via SUBCUTANEOUS
  Filled 2014-11-23: qty 0.4

## 2014-11-23 MED ORDER — OXYCODONE HCL 5 MG PO TABS: 10.0000 mg | ORAL_TABLET | Freq: Four times a day (QID) | ORAL | Status: DC | PRN

## 2014-11-23 MED ORDER — DOXYCYCLINE HYCLATE 100 MG IV SOLR
200.0000 mg | Freq: Once | INTRAVENOUS | Status: AC
  Administered 2014-11-23: 200 mg via INTRAVENOUS
  Filled 2014-11-23: qty 200

## 2014-11-23 MED ORDER — SERTRALINE HCL 50 MG PO TABS
25.0000 mg | ORAL_TABLET | Freq: Every day | ORAL | Status: DC
  Administered 2014-11-23 – 2014-11-24 (×2): 25 mg via ORAL
  Filled 2014-11-23 (×2): qty 1

## 2014-11-23 MED ORDER — HYDROCODONE-ACETAMINOPHEN 5-325 MG PO TABS
1.0000 | ORAL_TABLET | ORAL | Status: DC | PRN
  Administered 2014-11-23 – 2014-11-24 (×2): 1 via ORAL
  Filled 2014-11-23 (×2): qty 1

## 2014-11-23 MED ORDER — IOHEXOL 300 MG/ML  SOLN
75.0000 mL | Freq: Once | INTRAMUSCULAR | Status: AC | PRN
  Administered 2014-11-23: 75 mL via INTRAVENOUS

## 2014-11-23 MED ORDER — IBUPROFEN 400 MG PO TABS
400.0000 mg | ORAL_TABLET | Freq: Three times a day (TID) | ORAL | Status: DC
  Administered 2014-11-24: 400 mg via ORAL
  Filled 2014-11-23: qty 1

## 2014-11-23 MED ORDER — ONDANSETRON HCL 4 MG/2ML IJ SOLN: 4.0000 mg | Freq: Four times a day (QID) | INTRAMUSCULAR | Status: DC | PRN
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
## 2014-11-23 NOTE — ED Notes (Addendum)
Patient complaining of numbness around mouth and difficulty speaking starting at 1000.

## 2014-11-23 NOTE — Progress Notes (Signed)
CODE STROKED CALLED BY PHONE AT 1318 BEEPER WENT OFF 1320 PT ON SCANNER AT 1321 RADIOLOGIST CALLED 1325

## 2014-11-23 NOTE — ED Notes (Signed)
Patient to CT with Gaetano HawthorneJJ Cruise, RN

## 2014-11-23 NOTE — ED Notes (Signed)
Dr. Schlossman at bedside. 

## 2014-11-23 NOTE — Progress Notes (Signed)
RN paged this NP to review left ankle xray of pt and prn pain meds. Xray is + ankle fx. There is no note about this in the H&P, so unsure as to how this occurred. This NP ordered ice, elevation, bedrest and pain meds. This NP spoke to [facility] doc in house at [facility], Dr. Selena BattenKim, who is going to call ortho consult.  Jimmye NormanKaren Kirby-Graham, NP [facility]

## 2014-11-23 NOTE — H&P (Signed)
[facility] History and Physical  Craig SiJames D Mcmahen ZOX:[id number] DOB: February 20, 1956 DOA: 11/23/2014  Referring physician: ER PCP: No PCP Per Patient   Chief Complaint: Dysarthria  HPI: [name] is a 59 y.o. male  This is a 59 year old man who has a history of bipolar disorder and obsessive-compulsive disorder. He also has a history of alcohol abuse. He says that he ran of his Xanax in the last week. He describes an episode of dysarthria for the last 12 hours or so. He denies any other symptoms such as diplopia, unsteadiness of gait, limb weakness. There is no loss of consciousness. There is no chest pain, palpitations, abdominal pain or nausea or vomiting. There is no fever. There is no skin rash. MRI brain scan is negative. He is now being admitted for further investigation.   Review of Systems:  Apart from symptoms above, all systems negative.  Past Medical History  Diagnosis Date  . OCD (obsessive compulsive disorder)   . Bipolar disorder   . Hx of medication noncompliance   . Alcohol abuse   . Hallucinations   . Paranoid ideation   . Homeless    Past Surgical History  Procedure Laterality Date  . Neck surgery    . Fracture surgery     Social History:  reports that he has never smoked. He has never used smokeless tobacco. He reports that he drinks alcohol. He reports that he uses illicit drugs (Marijuana).  Not on File  Family History  Problem Relation Age of Onset  . Kidney failure Mother   . Cancer Father       Prior to Admission medications   Medication Sig Start Date End Date Taking? Authorizing Provider  ALPRAZolam Prudy Feeler(XANAX) 0.5 MG tablet Take 0.5 mg by mouth 3 (three) times daily as needed for anxiety.   Yes Historical Provider, MD  hydrOXYzine (ATARAX/VISTARIL) 25 MG tablet Take 1 tablet (25 mg total) by mouth every 6 (six) hours. Patient taking differently: Take 25 mg by mouth once as needed for anxiety.  03/22/13  Yes Eber HongBrian Miller, MD  ibuprofen  (ADVIL,MOTRIN) 200 MG tablet Take 600 mg by mouth every 6 (six) hours as needed.   Yes Historical Provider, MD  loratadine (ALLERGY RELIEF) 10 MG tablet Take 10-20 mg by mouth at bedtime as needed for allergies.   Yes Historical Provider, MD  sertraline (ZOLOFT) 25 MG tablet Take 25 mg by mouth daily.   Yes Historical Provider, MD   Physical Exam: Filed Vitals:   11/23/14 1356 11/23/14 1400 11/23/14 1415 11/23/14 1544  BP: 140/90 137/90  135/89  Pulse: 80  78 76  Temp:      TempSrc:      Resp: 15  10 17   Height:      Weight:      SpO2: 100%  100% 98%    Wt Readings from Last 3 Encounters:  11/23/14 63.504 kg (140 lb)  11/23/14 63.504 kg (140 lb)  03/26/13 61.236 kg (135 lb)    General:  Appears somewhat histrionic. He appears to have dysarthria but his speech was clear when I walked into the room and it progressively got worse in the next minute with dysarthria. Eyes: PERRL, normal lids, irises & conjunctiva ENT: grossly normal hearing, lips & tongue Neck: no LAD, masses or thyromegaly Cardiovascular: RRR, no m/r/g. No LE edema. Telemetry: SR, no arrhythmias  Respiratory: CTA bilaterally, no w/r/r. Normal respiratory effort. Abdomen: soft, ntnd Skin: no rash or induration seen on limited exam  Musculoskeletal: grossly normal tone BUE/BLE Psychiatric: grossly normal mood and affect, speech fluent and appropriate Neurologic: grossly non-focal. In particular, there are no cerebellar signs.           Labs on Admission:  Basic Metabolic Panel:  Recent Labs Lab 11/23/14 1330 11/23/14 1332  NA 130* 131*  K 3.7 3.6  CL 96* 95*  CO2 25  --   GLUCOSE 158* 160*  BUN 13 12  CREATININE 0.74 0.60*  CALCIUM 8.6*  --    Liver Function Tests:  Recent Labs Lab 11/23/14 1330  AST 45*  ALT 32  ALKPHOS 61  BILITOT 0.7  PROT 7.3  ALBUMIN 3.7   No results for input(s): LIPASE, AMYLASE in the last 168 hours. No results for input(s): AMMONIA in the last 168  hours. CBC:  Recent Labs Lab 11/23/14 1330 11/23/14 1332  WBC 6.0  --   HGB 12.8* 13.9  HCT 36.6* 41.0  MCV 89.5  --   PLT 264  --    Cardiac Enzymes: No results for input(s): CKTOTAL, CKMB, CKMBINDEX, TROPONINI in the last 168 hours.  BNP (last 3 results) No results for input(s): BNP in the last 8760 hours.  ProBNP (last 3 results) No results for input(s): PROBNP in the last 8760 hours.  CBG:  Recent Labs Lab 11/23/14 1326  GLUCAP 157*    Radiological Exams on Admission: Ct Head Wo Contrast  11/23/2014   CLINICAL DATA:  Difficulty speaking.  EXAM: CT HEAD WITHOUT CONTRAST  TECHNIQUE: Contiguous axial images were obtained from the base of the skull through the vertex without intravenous contrast.  COMPARISON:  CT scan of April 12, 2005.  FINDINGS: Bony calvarium appears intact. Mild diffuse cortical atrophy is noted. No mass effect or midline shift is noted. Ventricular size is within normal limits. There is no evidence of mass lesion, hemorrhage or acute infarction.  IMPRESSION: Mild diffuse cortical atrophy. No acute intracranial abnormality seen. These results were called by telephone at the time of interpretation on 11/23/2014 at 1:33 pm to Dr. Alvira Monday , who verbally acknowledged these results.   Electronically Signed   By: Lupita Raider, M.D.   On: 11/23/2014 13:35   Ct Soft Tissue Neck W Contrast  11/23/2014   CLINICAL DATA:  Aphasia.  EXAM: CT NECK WITH CONTRAST  TECHNIQUE: Multidetector CT imaging of the neck was performed using the standard protocol following the bolus administration of intravenous contrast.  CONTRAST:  75mL OMNIPAQUE IOHEXOL 300 MG/ML  SOLN  COMPARISON:  None.  FINDINGS: Pharynx and larynx: No definite abnormality seen. Epiglottis appears normal.  Salivary glands: Parotid and submandibular glands appear normal.  Thyroid: Normal.  Lymph nodes: No significantly enlarged adenopathy is noted.  Vascular: Visualized vasculature appears normal.   Limited intracranial: No definite abnormality seen.  Visualized orbits: Normal.  Mastoids and visualized paranasal sinuses: No abnormality seen.  Skeleton: Multilevel degenerative disc disease is noted in the cervical spine.  Upper chest: No definite abnormality seen.  IMPRESSION: No significant abnormality seen in the soft tissues of the neck.   Electronically Signed   By: Lupita Raider, M.D.   On: 11/23/2014 14:42   Mr Brain Wo Contrast (neuro Protocol)  11/23/2014   CLINICAL DATA:  59 year old male with dysarthria. Abnormal speech. Initial encounter.  EXAM: MRI HEAD WITHOUT CONTRAST  TECHNIQUE: Multiplanar, multiecho pulse sequences of the brain and surrounding structures were obtained without intravenous contrast.  COMPARISON:  Neck CT with contrast 1427 hours today.  FINDINGS: The examination had to be discontinued prior to completion due to patient agitation.  Sequelae see in weighted imaging was obtained along with axial T2 and FLAIR imaging, and sagittal T1 weighted imaging. Some of these are mildly degraded by motion despite repeated imaging attempts. Diffusion-weighted imaging is of good quality.  Motion artifact through the level of the oral cavity and oropharynx on the sagittal T1 weighted image. The nasopharynx appears normal.  Grossly preserved major intracranial vascular flow voids. No ventriculomegaly. No midline shift, mass effect, or evidence of intracranial mass lesion. No restricted diffusion or evidence of acute infarction. No definite acute intracranial hemorrhage. Within the limitations of the study, gray and white matter signal appears within normal limits for age.  Grossly normal visualized internal auditory structures. Mild right ethmoid sinus mucosal thickening. Other visualized mastoid air cells and paranasal sinuses appear clear. Negative visualized scalp soft tissues.  IMPRESSION: 1. The examination had to be discontinued prior to completion. Some of the provided images are mildly  motion degraded. 2. No acute infarct.  No acute intracranial abnormality identified.   Electronically Signed   By: Odessa Fleming M.D.   On: 11/23/2014 15:44      Assessment/Plan   1. Dysarthria. Clinically, I'm not entirely convinced there is organic pathology here. MRI brain scan is negative. His physical signs are very soft. I will request neurology consultation.  Further recommendations will depend on patient's hospital progress.   Code Status: Full code.  DVT Prophylaxis: Lovenox.  Family Communication: I discussed the plan with the patient at the bedside.  Disposition Plan: Home when medically stable.   Time spent: 45 minutes.  Wilson Singer [facility] Pager [phone].

## 2014-11-23 NOTE — ED Notes (Signed)
Patient in CT

## 2014-11-23 NOTE — ED Provider Notes (Signed)
CSN: [id number]     Arrival date & time 11/23/14  1311 History   First MD Initiated Contact with Patient 11/23/14 1323     Chief Complaint  Patient presents with  . Code Stroke     (Consider location/radiation/quality/duration/timing/severity/associated sxs/prior Treatment) Patient is a 59 y.o. male presenting with neurologic complaint.  Neurologic Problem This is a new problem. The current episode started 3 to 5 hours ago. The problem occurs constantly. The problem has not changed since onset.Pertinent negatives include no chest pain, no abdominal pain, no headaches and no shortness of breath. He has tried nothing for the symptoms. The treatment provided no relief.    Past Medical History  Diagnosis Date  . OCD (obsessive compulsive disorder)   . Bipolar disorder   . Hx of medication noncompliance   . Alcohol abuse   . Hallucinations   . Paranoid ideation   . Homeless    Past Surgical History  Procedure Laterality Date  . Neck surgery    . Fracture surgery     Family History  Problem Relation Age of Onset  . Kidney failure Mother   . Cancer Father    History  Substance Use Topics  . Smoking status: Never Smoker   . Smokeless tobacco: Never Used  . Alcohol Use: Yes     Comment: daily    Review of Systems  Constitutional: Negative for fever.  HENT: Negative for sore throat.   Eyes: Negative for visual disturbance.  Respiratory: Negative for shortness of breath.   Cardiovascular: Negative for chest pain.  Gastrointestinal: Negative for nausea, vomiting, abdominal pain, diarrhea and constipation.  Genitourinary: Negative for difficulty urinating.  Musculoskeletal: Negative for back pain and neck stiffness.  Skin: Negative for rash.  Neurological: Positive for speech difficulty and numbness (around mouth). Negative for dizziness, syncope, facial asymmetry, weakness and headaches.      Allergies  Review of patient's allergies indicates no known allergies.  Home  Medications   Prior to Admission medications   Medication Sig Start Date End Date Taking? Authorizing Provider  ALPRAZolam Prudy Feeler) 0.5 MG tablet Take 0.5 mg by mouth 3 (three) times daily as needed for anxiety.   Yes Historical Provider, MD  hydrOXYzine (ATARAX/VISTARIL) 25 MG tablet Take 1 tablet (25 mg total) by mouth every 6 (six) hours. Patient taking differently: Take 25 mg by mouth once as needed for anxiety.  03/22/13  Yes Eber Hong, MD  ibuprofen (ADVIL,MOTRIN) 200 MG tablet Take 600 mg by mouth every 6 (six) hours as needed.   Yes Historical Provider, MD  loratadine (ALLERGY RELIEF) 10 MG tablet Take 10-20 mg by mouth at bedtime as needed for allergies.   Yes Historical Provider, MD  sertraline (ZOLOFT) 25 MG tablet Take 25 mg by mouth daily.   Yes Historical Provider, MD   BP 140/81 mmHg  Pulse 77  Temp(Src) 98.5 F (36.9 C) (Oral)  Resp 16  Ht 5\' 8"  (1.727 m)  Wt 140 lb (63.504 kg)  BMI 21.29 kg/m2  SpO2 100% Physical Exam  Constitutional: He is oriented to person, place, and time. He appears well-developed and well-nourished. No distress.  HENT:  Head: Normocephalic and atraumatic.  Eyes: Conjunctivae and EOM are normal.  Neck: Normal range of motion.  Cardiovascular: Normal rate, regular rhythm, normal heart sounds and intact distal pulses.  Exam reveals no gallop and no friction rub.   No murmur heard. Pulmonary/Chest: Effort normal and breath sounds normal. No respiratory distress. He has no wheezes. He  has no rales.  Abdominal: Soft. He exhibits no distension. There is no tenderness. There is no guarding.  Musculoskeletal: He exhibits edema (left ankle swelling).  Neurological: He is alert and oriented to person, place, and time. He has normal strength. A cranial nerve deficit (dysarthria.  Normal palate elevation, midline tongue, symmetric face, difficulty puffing cheeks. normal eye closing, normal shoulder shrug, normal EOM) is present. No sensory deficit.  Coordination normal. GCS eye subscore is 4. GCS verbal subscore is 5. GCS motor subscore is 6.  No drift upper or lower ext   Skin: Skin is warm and dry. He is not diaphoretic.  Nursing note and vitals reviewed.   ED Course  Procedures (including critical care time) Labs Review Labs Reviewed  CBC - Abnormal; Notable for the following:    RBC 4.09 (*)    Hemoglobin 12.8 (*)    HCT 36.6 (*)    All other components within normal limits  COMPREHENSIVE METABOLIC PANEL - Abnormal; Notable for the following:    Sodium 130 (*)    Chloride 96 (*)    Glucose, Bld 158 (*)    Calcium 8.6 (*)    AST 45 (*)    All other components within normal limits  URINALYSIS, ROUTINE W REFLEX MICROSCOPIC (NOT AT [facility]) - Abnormal; Notable for the following:    Color, Urine STRAW (*)    All other components within normal limits  I-STAT CHEM 8, ED - Abnormal; Notable for the following:    Sodium 131 (*)    Chloride 95 (*)    Creatinine, Ser 0.60 (*)    Glucose, Bld 160 (*)    All other components within normal limits  CBG MONITORING, ED - Abnormal; Notable for the following:    Glucose-Capillary 157 (*)    All other components within normal limits  ETHANOL  PROTIME-INR  APTT  URINE RAPID DRUG SCREEN, HOSP PERFORMED  LYME DISEASE DNA BY PCR(BORRELIA BURG)  B. BURGDORFI ANTIBODIES  COMPREHENSIVE METABOLIC PANEL  CBC  I-STAT TROPOININ, ED    Imaging Review Dg Ankle Complete Left  11/23/2014   CLINICAL DATA:  59 year old male with trauma and swelling of the left ankle  EXAM: LEFT ANKLE COMPLETE - 3+ VIEW  COMPARISON:  None.  FINDINGS: There is an oblique fracture of the distal fibula with extension of the fracture into the distal tibiofibular syndesmosis. There is approximately 4 mm lateral displacement of the distal fracture fragment. The talotibial articulation is intact. There is soft tissue swelling over the lateral malleolus.  IMPRESSION: Oblique fracture of the distal fibula.   Electronically  Signed   By: Elgie Collard M.D.   On: 11/23/2014 22:32   Ct Head Wo Contrast  11/23/2014   CLINICAL DATA:  Difficulty speaking.  EXAM: CT HEAD WITHOUT CONTRAST  TECHNIQUE: Contiguous axial images were obtained from the base of the skull through the vertex without intravenous contrast.  COMPARISON:  CT scan of April 12, 2005.  FINDINGS: Bony calvarium appears intact. Mild diffuse cortical atrophy is noted. No mass effect or midline shift is noted. Ventricular size is within normal limits. There is no evidence of mass lesion, hemorrhage or acute infarction.  IMPRESSION: Mild diffuse cortical atrophy. No acute intracranial abnormality seen. These results were called by telephone at the time of interpretation on 11/23/2014 at 1:33 pm to Dr. Alvira Monday , who verbally acknowledged these results.   Electronically Signed   By: Lupita Raider, M.D.   On: 11/23/2014 13:35  Ct Soft Tissue Neck W Contrast  11/23/2014   CLINICAL DATA:  Aphasia.  EXAM: CT NECK WITH CONTRAST  TECHNIQUE: Multidetector CT imaging of the neck was performed using the standard protocol following the bolus administration of intravenous contrast.  CONTRAST:  75mL OMNIPAQUE IOHEXOL 300 MG/ML  SOLN  COMPARISON:  None.  FINDINGS: Pharynx and larynx: No definite abnormality seen. Epiglottis appears normal.  Salivary glands: Parotid and submandibular glands appear normal.  Thyroid: Normal.  Lymph nodes: No significantly enlarged adenopathy is noted.  Vascular: Visualized vasculature appears normal.  Limited intracranial: No definite abnormality seen.  Visualized orbits: Normal.  Mastoids and visualized paranasal sinuses: No abnormality seen.  Skeleton: Multilevel degenerative disc disease is noted in the cervical spine.  Upper chest: No definite abnormality seen.  IMPRESSION: No significant abnormality seen in the soft tissues of the neck.   Electronically Signed   By: Lupita Raider, M.D.   On: 11/23/2014 14:42   Mr Brain Wo Contrast  (neuro Protocol)  11/23/2014   CLINICAL DATA:  59 year old male with dysarthria. Abnormal speech. Initial encounter.  EXAM: MRI HEAD WITHOUT CONTRAST  TECHNIQUE: Multiplanar, multiecho pulse sequences of the brain and surrounding structures were obtained without intravenous contrast.  COMPARISON:  Neck CT with contrast 1427 hours today.  FINDINGS: The examination had to be discontinued prior to completion due to patient agitation.  Sequelae see in weighted imaging was obtained along with axial T2 and FLAIR imaging, and sagittal T1 weighted imaging. Some of these are mildly degraded by motion despite repeated imaging attempts. Diffusion-weighted imaging is of good quality.  Motion artifact through the level of the oral cavity and oropharynx on the sagittal T1 weighted image. The nasopharynx appears normal.  Grossly preserved major intracranial vascular flow voids. No ventriculomegaly. No midline shift, mass effect, or evidence of intracranial mass lesion. No restricted diffusion or evidence of acute infarction. No definite acute intracranial hemorrhage. Within the limitations of the study, gray and white matter signal appears within normal limits for age.  Grossly normal visualized internal auditory structures. Mild right ethmoid sinus mucosal thickening. Other visualized mastoid air cells and paranasal sinuses appear clear. Negative visualized scalp soft tissues.  IMPRESSION: 1. The examination had to be discontinued prior to completion. Some of the provided images are mildly motion degraded. 2. No acute infarct.  No acute intracranial abnormality identified.   Electronically Signed   By: Odessa Fleming M.D.   On: 11/23/2014 15:44     EKG Interpretation   Date/Time:  Thursday November 23 2014 13:26:03 EDT Ventricular Rate:  89 PR Interval:  145 QRS Duration: 78 QT Interval:  361 QTC Calculation: 439 R Axis:   76 Text Interpretation:  Sinus rhythm Baseline wander in lead(s) I Confirmed  by [facility] MD, Tonjua Rossetti  ([id number]) on 11/23/2014 11:45:19 PM      MDM   Final diagnoses:  Soft tissue mass  Swelling of ankle joint, left   59 yo male with history of bipolar disorder off medications for 10-12 days presents with concern for dysarthria, time of onset 10AM.  Pt without other neurologic deficits on exam however given dysarthria, Code Stroke Initiated.  CT Head, MR show no acute abnormalities. CT neck ordered to evaluate for other base of tongue mass or oropharyngeal abnormality and showed no acute findings.  Tick taken off pt, and while lyme is rare in this region, lyme titers drawn and doxycycline given per neuro recs for possible CN palsy secondary to Lyme.  Doubt GBS at  this time.  Admit to hospitalist for further care and follow up as outpt with Neurology.     Alvira MondayErin Lela Gell, MD 11/24/14 1054

## 2014-11-23 NOTE — ED Notes (Signed)
Pt reports numbness and tingling around his mouth. Pt states symptoms started this am at 1000. Pt denies weakness in extremities or other symptoms. Pt denies pain.

## 2014-11-23 NOTE — ED Notes (Addendum)
Removed small brown tick from abd, cleaned area with alcohol.

## 2014-11-23 NOTE — ED Notes (Signed)
Teleneuro exam in progress.

## 2014-11-23 NOTE — ED Notes (Signed)
Patient remains in Imaging. Will reassess on return to ED.

## 2014-11-24 DIAGNOSIS — F411 Generalized anxiety disorder: Secondary | ICD-10-CM

## 2014-11-24 DIAGNOSIS — R471 Dysarthria and anarthria: Secondary | ICD-10-CM

## 2014-11-24 DIAGNOSIS — F1020 Alcohol dependence, uncomplicated: Secondary | ICD-10-CM

## 2014-11-24 DIAGNOSIS — S82892A Other fracture of left lower leg, initial encounter for closed fracture: Secondary | ICD-10-CM

## 2014-11-24 LAB — COMPREHENSIVE METABOLIC PANEL
ALT: 35 U/L (ref 17–63)
AST: 44 U/L — ABNORMAL HIGH (ref 15–41)
Albumin: 3.8 g/dL (ref 3.5–5.0)
Alkaline Phosphatase: 60 U/L (ref 38–126)
Anion gap: 8 (ref 5–15)
BUN: 7 mg/dL (ref 6–20)
CHLORIDE: 98 mmol/L — AB (ref 101–111)
CO2: 28 mmol/L (ref 22–32)
Calcium: 8.7 mg/dL — ABNORMAL LOW (ref 8.9–10.3)
Creatinine, Ser: 0.76 mg/dL (ref 0.61–1.24)
GFR calc Af Amer: >60 mL/min (ref >60)
GFR calc non Af Amer: >60 mL/min (ref >60)
Glucose, Bld: 89 mg/dL (ref 65–99)
Potassium: 4 mmol/L (ref 3.5–5.1)
Sodium: 134 mmol/L — ABNORMAL LOW (ref 135–145)
Total Bilirubin: 0.7 mg/dL (ref 0.3–1.2)
Total Protein: 7.6 g/dL (ref 6.5–8.1)

## 2014-11-24 LAB — CBC
HEMATOCRIT: 40.2 % (ref 39.0–52.0)
HEMOGLOBIN: 13.7 g/dL (ref 13.0–17.0)
MCH: 30.9 pg (ref 26.0–34.0)
MCHC: 34.1 g/dL (ref 30.0–36.0)
MCV: 90.5 fL (ref 78.0–100.0)
Platelets: 339 10*3/uL (ref 150–400)
RBC: 4.44 MIL/uL (ref 4.22–5.81)
RDW: 13.6 % (ref 11.5–15.5)
WBC: 4.2 10*3/uL (ref 4.0–10.5)

## 2014-11-24 NOTE — Discharge Summary (Addendum)
Physician Discharge Summary  [name] AVW:[id number] DOB: 1956/01/11 DOA: 2014-12-17  PCP: No PCP Per Patient  Admit date: 12-17-2014 Discharge date: 11/24/2014  Time spent: 25 minutes  PATIENT LEFT THE HOSPITAL AGAINST MEDICAL ADVICE  Discharge Diagnoses:  Active Problems:   Alcohol dependence   Dysarthria  severe anxiety disorder Acute dysarthria likely related to medication effects Alcoholism Oblique fracture of the distal left fibula.   Filed Weights   12-17-2014 1328  Weight: 63.504 kg (140 lb)    History of present illness:  This patient presents to the hospital with dysarthria that began 12 hours prior to admission. He denied any diplopia, unsteady gait or limb weakness. There was no loss of consciousness. Patient reports that he has history of alcohol use. He also takes Xanax, but ran out of his medication approximately 1 week prior to admission. He is being admitted for further treatment.  Hospital Course:  Patient was admitted to the hospital. MRI of the brain was negative for acute infarct. He was seen by neurology who felt that his dysarthria. He related to some medication effect. Patient did admit to taking medication from a friend. He is unaware exactly what the medication was. The following day, his dysarthria has completely resolved and he did not have any further symptoms. He did not have any unilateral weakness or numbness.  He did complain of significant pain in his left lower leg. This was further evaluated with x-rays of his left ankle that showed an oblique fracture of distal left fibula. Orthopedic consultation was requested. The patient left the hospital AGAINST MEDICAL ADVICE before being seen by orthopedics.  Procedures:    Consultations:  Neurology  Discharge Exam: Filed Vitals:   11/24/14 0644  BP: 112/78  Pulse: 69  Temp: 98.8 F (37.1 C)  Resp: 17    General: No acute distress Cardiovascular: S1, S2, regular rate and  rhythm Respiratory: Clear to auscultation bilaterally  Discharge Instructions    Discharge Medication List as of 11/24/2014  1:07 PM    CONTINUE these medications which have NOT CHANGED   Details  ALPRAZolam (XANAX) 0.5 MG tablet Take 0.5 mg by mouth 3 (three) times daily as needed for anxiety., Until Discontinued, Historical Med    hydrOXYzine (ATARAX/VISTARIL) 25 MG tablet Take 1 tablet (25 mg total) by mouth every 6 (six) hours., Starting 03/22/2013, Until Discontinued, Print    ibuprofen (ADVIL,MOTRIN) 200 MG tablet Take 600 mg by mouth every 6 (six) hours as needed., Until Discontinued, Historical Med    loratadine (ALLERGY RELIEF) 10 MG tablet Take 10-20 mg by mouth at bedtime as needed for allergies., Until Discontinued, Historical Med    sertraline (ZOLOFT) 25 MG tablet Take 25 mg by mouth daily., Until Discontinued, Historical Med       No Known Allergies    The results of significant diagnostics from this hospitalization (including imaging, microbiology, ancillary and laboratory) are listed below for reference.    Significant Diagnostic Studies: Dg Ankle Complete Left  12-17-2014   CLINICAL DATA:  59 year old male with trauma and swelling of the left ankle  EXAM: LEFT ANKLE COMPLETE - 3+ VIEW  COMPARISON:  None.  FINDINGS: There is an oblique fracture of the distal fibula with extension of the fracture into the distal tibiofibular syndesmosis. There is approximately 4 mm lateral displacement of the distal fracture fragment. The talotibial articulation is intact. There is soft tissue swelling over the lateral malleolus.  IMPRESSION: Oblique fracture of the distal fibula.   Electronically  Signed   By: Elgie CollardArash  Radparvar M.D.   On: 11/23/2014 22:32   Ct Head Wo Contrast  11/23/2014   CLINICAL DATA:  Difficulty speaking.  EXAM: CT HEAD WITHOUT CONTRAST  TECHNIQUE: Contiguous axial images were obtained from the base of the skull through the vertex without intravenous contrast.   COMPARISON:  CT scan of April 12, 2005.  FINDINGS: Bony calvarium appears intact. Mild diffuse cortical atrophy is noted. No mass effect or midline shift is noted. Ventricular size is within normal limits. There is no evidence of mass lesion, hemorrhage or acute infarction.  IMPRESSION: Mild diffuse cortical atrophy. No acute intracranial abnormality seen. These results were called by telephone at the time of interpretation on 11/23/2014 at 1:33 pm to Dr. Alvira MondayERIN SCHLOSSMAN , who verbally acknowledged these results.   Electronically Signed   By: Lupita RaiderJames  Green Jr, M.D.   On: 11/23/2014 13:35   Ct Soft Tissue Neck W Contrast  11/23/2014   CLINICAL DATA:  Aphasia.  EXAM: CT NECK WITH CONTRAST  TECHNIQUE: Multidetector CT imaging of the neck was performed using the standard protocol following the bolus administration of intravenous contrast.  CONTRAST:  75mL OMNIPAQUE IOHEXOL 300 MG/ML  SOLN  COMPARISON:  None.  FINDINGS: Pharynx and larynx: No definite abnormality seen. Epiglottis appears normal.  Salivary glands: Parotid and submandibular glands appear normal.  Thyroid: Normal.  Lymph nodes: No significantly enlarged adenopathy is noted.  Vascular: Visualized vasculature appears normal.  Limited intracranial: No definite abnormality seen.  Visualized orbits: Normal.  Mastoids and visualized paranasal sinuses: No abnormality seen.  Skeleton: Multilevel degenerative disc disease is noted in the cervical spine.  Upper chest: No definite abnormality seen.  IMPRESSION: No significant abnormality seen in the soft tissues of the neck.   Electronically Signed   By: Lupita RaiderJames  Green Jr, M.D.   On: 11/23/2014 14:42   Mr Brain Wo Contrast (neuro Protocol)  11/23/2014   CLINICAL DATA:  59 year old male with dysarthria. Abnormal speech. Initial encounter.  EXAM: MRI HEAD WITHOUT CONTRAST  TECHNIQUE: Multiplanar, multiecho pulse sequences of the brain and surrounding structures were obtained without intravenous contrast.   COMPARISON:  Neck CT with contrast 1427 hours today.  FINDINGS: The examination had to be discontinued prior to completion due to patient agitation.  Sequelae see in weighted imaging was obtained along with axial T2 and FLAIR imaging, and sagittal T1 weighted imaging. Some of these are mildly degraded by motion despite repeated imaging attempts. Diffusion-weighted imaging is of good quality.  Motion artifact through the level of the oral cavity and oropharynx on the sagittal T1 weighted image. The nasopharynx appears normal.  Grossly preserved major intracranial vascular flow voids. No ventriculomegaly. No midline shift, mass effect, or evidence of intracranial mass lesion. No restricted diffusion or evidence of acute infarction. No definite acute intracranial hemorrhage. Within the limitations of the study, gray and white matter signal appears within normal limits for age.  Grossly normal visualized internal auditory structures. Mild right ethmoid sinus mucosal thickening. Other visualized mastoid air cells and paranasal sinuses appear clear. Negative visualized scalp soft tissues.  IMPRESSION: 1. The examination had to be discontinued prior to completion. Some of the provided images are mildly motion degraded. 2. No acute infarct.  No acute intracranial abnormality identified.   Electronically Signed   By: Odessa FlemingH  Lerette M.D.   On: 11/23/2014 15:44    Microbiology: No results found for this or any previous visit (from the past 240 hour(s)).   Labs: Basic Metabolic  Panel:  Recent Labs Lab 11/23/14 1330 11/23/14 1332 11/24/14 0657  NA 130* 131* 134*  K 3.7 3.6 4.0  CL 96* 95* 98*  CO2 25  --  28  GLUCOSE 158* 160* 89  BUN CREATININE 0.74 0.60* 0.76  CALCIUM 8.6*  --  8.7*   Liver Function Tests:  Recent Labs Lab 11/23/14 1330 11/24/14 0657  AST 45* 44*  ALT 32 35  ALKPHOS 61 60  BILITOT 0.7 0.7  PROT 7.3 7.6  ALBUMIN 3.7 3.8   No results for input(s): LIPASE, AMYLASE in the last  168 hours. No results for input(s): AMMONIA in the last 168 hours. CBC:  Recent Labs Lab 11/23/14 1330 11/23/14 1332 11/24/14 0657  WBC 6.0  --  4.2  HGB 12.8* 13.9 13.7  HCT 36.6* 41.0 40.2  MCV 89.5  --  90.5  PLT 264  --  339   Cardiac Enzymes: No results for input(s): CKTOTAL, CKMB, CKMBINDEX, TROPONINI in the last 168 hours. BNP: BNP (last 3 results) No results for input(s): BNP in the last 8760 hours.  ProBNP (last 3 results) No results for input(s): PROBNP in the last 8760 hours.  CBG:  Recent Labs Lab 11/23/14 1326  GLUCAP 157*       Signed:  MEMON,JEHANZEB  [facility] 11/24/2014, 7:25 PM

## 2014-11-24 NOTE — Progress Notes (Signed)
Patient stated to C. Raul DelAlston, RN he wanted to leave, removed his IV site and signed out AMA this morning about 1130. Escorted to security to pick up his wallet and knife by C. Raul DelAlston, Charity fundraiserN. Notified Dr. Kerry HoughMemon. Earnstine RegalAshley Jamespaul Secrist, RN

## 2014-11-24 NOTE — Progress Notes (Signed)
Late entry for 0815. Patient stated "I've got to get out of here by 9 o'clock for an appointment to close on a house". Discussed with patient that no discharge orders in place, would be seen by Dr. Kerry HoughMemon and Dr. Romeo AppleHarrison today. Notified patient nursing would let Dr. Kerry HoughMemon know of his wishes to discharge home. Instructed pt to call for assistance and not get up on his own for safety due to left ankle injury/swelling. Pt has refused bed alarm and ambulates in room and hallway frequently despite education to call for assistance. MD aware per report. Pt became agitated and stated "get the doctor and my prescriptions". Notified patient Dr. Romeo AppleHarrison aware of consult, would see him today. Dr Kerry HoughMemon to address possible discharge once he was seen by Dr. Romeo AppleHarrison. Pt stated "I'm leaving one way or the other". Discussed signing out against medical advice was not advised, no prescriptions would be given, insurance will not cover hospital stay. Dr. Kerry HoughMemon aware patient may want to leave AMA. Earnstine RegalAshley Cary Wilford, RN

## 2014-11-25 LAB — LYME DISEASE DNA BY PCR(BORRELIA BURG): LYME DISEASE(B. BURGDORFERI) PCR: NEGATIVE

## 2014-11-25 LAB — B. BURGDORFI ANTIBODIES

## 2014-12-02 ENCOUNTER — Encounter (HOSPITAL_COMMUNITY): Payer: Self-pay

## 2014-12-02 ENCOUNTER — Emergency Department (HOSPITAL_COMMUNITY)
Admission: EM | Admit: 2014-12-02 | Discharge: 2014-12-02 | Discharge status: Against Medical Advice | Payer: Self-pay | Attending: Emergency Medicine | Admitting: Emergency Medicine

## 2014-12-02 DIAGNOSIS — R1110 Vomiting, unspecified: Secondary | ICD-10-CM | POA: Insufficient documentation

## 2014-12-02 DIAGNOSIS — R531 Weakness: Secondary | ICD-10-CM | POA: Insufficient documentation

## 2014-12-02 DIAGNOSIS — R197 Diarrhea, unspecified: Secondary | ICD-10-CM | POA: Insufficient documentation

## 2014-12-02 DIAGNOSIS — R509 Fever, unspecified: Secondary | ICD-10-CM | POA: Insufficient documentation

## 2014-12-02 NOTE — ED Notes (Signed)
Patient yelling at everyone that comes in his room and everyone that he comes into contact with.

## 2014-12-02 NOTE — ED Notes (Signed)
Patient continues to yell at everyone including the officer. Told by the officer to stay in his room, but he continues to come out the door. States that he is going to leave, that maybe he will trip and fall and he will sue the hospital.

## 2014-12-02 NOTE — ED Notes (Signed)
Pt is very uncooperative, lists multiple complaints, fever, weakness, vomiting, diarrha

## 2014-12-25 ENCOUNTER — Emergency Department (HOSPITAL_COMMUNITY): Payer: Self-pay

## 2014-12-25 ENCOUNTER — Encounter (HOSPITAL_COMMUNITY): Payer: Self-pay | Admitting: *Deleted

## 2014-12-25 ENCOUNTER — Emergency Department (HOSPITAL_COMMUNITY)
Admission: EM | Admit: 2014-12-25 | Discharge: 2014-12-25 | Disposition: A | Discharge status: Home / Self Care | Payer: Self-pay | Attending: Emergency Medicine | Admitting: Emergency Medicine

## 2014-12-25 DIAGNOSIS — Z79899 Other long term (current) drug therapy: Secondary | ICD-10-CM | POA: Insufficient documentation

## 2014-12-25 DIAGNOSIS — Z9119 Patient's noncompliance with other medical treatment and regimen: Secondary | ICD-10-CM | POA: Insufficient documentation

## 2014-12-25 DIAGNOSIS — S8262XG Displaced fracture of lateral malleolus of left fibula, subsequent encounter for closed fracture with delayed healing: Secondary | ICD-10-CM | POA: Insufficient documentation

## 2014-12-25 DIAGNOSIS — F42 Obsessive-compulsive disorder: Secondary | ICD-10-CM | POA: Insufficient documentation

## 2014-12-25 DIAGNOSIS — X58XXXD Exposure to other specified factors, subsequent encounter: Secondary | ICD-10-CM | POA: Insufficient documentation

## 2014-12-25 DIAGNOSIS — F329 Major depressive disorder, single episode, unspecified: Secondary | ICD-10-CM | POA: Insufficient documentation

## 2014-12-25 DIAGNOSIS — K120 Recurrent oral aphthae: Secondary | ICD-10-CM | POA: Insufficient documentation

## 2014-12-25 DIAGNOSIS — Z590 Homelessness: Secondary | ICD-10-CM | POA: Insufficient documentation

## 2014-12-25 MED ORDER — OXYCODONE-ACETAMINOPHEN 5-325 MG PO TABS: 1.0000 | ORAL_TABLET | ORAL | Status: DC | PRN

## 2014-12-25 MED ORDER — CLINDAMYCIN HCL 150 MG PO CAPS: 150.0000 mg | ORAL_CAPSULE | Freq: Four times a day (QID) | ORAL | Status: DC

## 2014-12-25 NOTE — ED Notes (Signed)
Pt. Reports injuring left ankle several weeks ago. Pt. C/o swelling and pain to left ankle. Ice pack placed on ankle.

## 2014-12-25 NOTE — ED Provider Notes (Addendum)
CSN: [id number]     Arrival date & time 12/25/14  0123 History   First MD Initiated Contact with Patient 12/25/14 0252     Chief Complaint  Patient presents with  . Ankle Pain     (Consider location/radiation/quality/duration/timing/severity/associated sxs/prior Treatment) The history is provided by the patient.   59 year old male had injured his left ankle several weeks ago. He states that he was supposed to have a cast applied and it never was. He has been walking on it since then. Is complaining of increasing pain and difficulty walking. He has never followed up with orthopedics. He is been taking acetaminophen without relief. He rates pain at 8/10. He states that he has fallen several times.  Past Medical History  Diagnosis Date  . OCD (obsessive compulsive disorder)   . Bipolar disorder   . Hx of medication noncompliance   . Alcohol abuse   . Hallucinations   . Paranoid ideation   . Homeless    Past Surgical History  Procedure Laterality Date  . Neck surgery    . Fracture surgery     Family History  Problem Relation Age of Onset  . Kidney failure Mother   . Cancer Father    Social History  Substance Use Topics  . Smoking status: Never Smoker   . Smokeless tobacco: Never Used  . Alcohol Use: Yes     Comment: daily    Review of Systems  All other systems reviewed and are negative.     Allergies  Review of patient's allergies indicates no known allergies.  Home Medications   Prior to Admission medications   Medication Sig Start Date End Date Taking? Authorizing Provider  ALPRAZolam Prudy Feeler) 0.5 MG tablet Take 0.5 mg by mouth 3 (three) times daily as needed for anxiety.    Historical Provider, MD  hydrOXYzine (ATARAX/VISTARIL) 25 MG tablet Take 1 tablet (25 mg total) by mouth every 6 (six) hours. Patient taking differently: Take 25 mg by mouth once as needed for anxiety.  03/22/13   Eber Hong, MD  ibuprofen (ADVIL,MOTRIN) 200 MG tablet Take 600 mg by mouth  every 6 (six) hours as needed.    Historical Provider, MD  loratadine (ALLERGY RELIEF) 10 MG tablet Take 10-20 mg by mouth at bedtime as needed for allergies.    Historical Provider, MD  oxyCODONE-acetaminophen (PERCOCET) 5-325 MG per tablet Take 1 tablet by mouth every 4 (four) hours as needed for moderate pain. 12/25/14   Dione Booze, MD  oxyCODONE-acetaminophen (PERCOCET/ROXICET) 5-325 MG per tablet Take 1 tablet by mouth every 4 (four) hours as needed. 12/25/14   Dione Booze, MD  sertraline (ZOLOFT) 25 MG tablet Take 25 mg by mouth daily.    Historical Provider, MD   BP 117/94 mmHg  Pulse 75  Temp(Src) 98.4 F (36.9 C) (Oral)  Resp 16  Ht  (1.727 m)  Wt 135 lb (61.236 kg)  BMI 20.53 kg/m2  SpO2 100% Physical Exam  Nursing note and vitals reviewed.  59 year old male, resting comfortably and in no acute distress. Vital signs are significant for hypertension. Oxygen saturation is 100%, which is normal. Head is normocephalic and atraumatic. PERRLA, EOMI. Oropharynx is clear. There is a tender aphthous ulcer on his right upper gingiva by tooth #7. Neck is nontender and supple without adenopathy or JVD. Back is nontender and there is no CVA tenderness. Lungs are clear without rales, wheezes, or rhonchi. Chest is nontender. Heart has regular rate and rhythm without murmur.  Abdomen is soft, flat, nontender without masses or hepatosplenomegaly and peristalsis is normoactive. Extremities: Swelling is noted over the lateral aspect of the left ankle with tenderness over the lateral malleolus. There is no instability at the ankle joint and anterior dorsi and is negative. Distal neurovascular exam is intact with strong pulses, prompt capillary refill, normal sensation. There is no tenderness over the proximal fibula. Full range of motion present in all other joints without pain. Skin is warm and dry without rash. Neurologic: Mental status is normal, cranial nerves are intact, there are no motor  or sensory deficits.  ED Course  Procedures (including critical care time)  Imaging Review Dg Ankle Complete Left  12/25/2014   CLINICAL DATA:  Injured left ankle several weeks ago with swelling. Subsequent encounter  EXAM: LEFT ANKLE COMPLETE - 3+ VIEW  COMPARISON:  11/23/2014  FINDINGS: There is an oblique fracture of the distal fibula with periosteal reaction compatible with attempted healing. Fracture remains mildly displaced laterally by 2-3 mm. Fracture is at the level of the ankle joint and above.  Newly seen transverse fracture through the distal tibial metaphysis, nondisplaced. On lateral imaging, there appears to be continuation in the coronal plane towards the ankle joint. Normal ankle and hindfoot alignment.  Ankle joint effusion.  IMPRESSION: 1. Newly seen nondisplaced fractures of the distal tibia with transverse and vertical components. 2. Stable mildly displaced distal fibular fracture since 11/23/2014. Partial healing has occurred.   Electronically Signed   By: Marnee Spring M.D.   On: 12/25/2014 02:49   I, Jimmie Rueter, personally reviewed and evaluated these images results as part of my medical decision-making.  MDM   Final diagnoses:  Ankle fracture, lateral malleolus, closed, left, with delayed healing, subsequent encounter  Aphthous ulcer of mouth    Left ankle fracture which is nonhealing because it is not been properly immobilized. X-ray shows mildly displaced distal fibular fracture with probable new nondisplaced fractures in the distal tibia. I do not see any evidence of callus formation. Old records are reviewed and he was seen approximately 1 month ago and left the hospital AGAINST MEDICAL ADVICE before he was evaluated by orthopedics. He is placed in a cam walker and given crutches and is referred to orthopedics for follow-up. Prescription is given for oxycodone-acetaminophen. he is also given a prescription for clindamycin for his aphthous ulcer.     Dione Booze,  MD 12/25/14 1610  Dione Booze, MD 12/25/14 [phone]

## 2014-12-25 NOTE — Discharge Instructions (Signed)
Wear the cam walker until the orthopedic doctor tells you that you don't need it. You can take it off to bathe.  Ankle Fracture A fracture is a break in a bone. The ankle joint is made up of three bones. These include the lower (distal)sections of your lower leg bones, called the tibia and fibula, along with a bone in your foot, called the talus. Depending on how bad the break is and if more than one ankle joint bone is broken, a cast or splint is used to protect and keep your injured bone from moving while it heals. Sometimes, surgery is required to help the fracture heal properly.  There are two general types of fractures:  Stable fracture. This includes a single fracture line through one bone, with no injury to ankle ligaments. A fracture of the talus that does not have any displacement (movement of the bone on either side of the fracture line) is also stable.  Unstable fracture. This includes more than one fracture line through one or more bones in the ankle joint. It also includes fractures that have displacement of the bone on either side of the fracture line. CAUSES 1. A direct blow to the ankle.  2. Quickly and severely twisting your ankle. 3. Trauma, such as a car accident or falling from a significant height. RISK FACTORS You may be at a higher risk of ankle fracture if: 1. You have certain medical conditions. 2. You are involved in high-impact sports. 3. You are involved in a high-impact car accident. SIGNS AND SYMPTOMS  1. Tender and swollen ankle. 2. Bruising around the injured ankle. 3. Pain on movement of the ankle. 4. Difficulty walking or putting weight on the ankle. 5. A cold foot below the site of the ankle injury. This can occur if the blood vessels passing through your injured ankle were also damaged. 6. Numbness in the foot below the site of the ankle injury. DIAGNOSIS  An ankle fracture is usually diagnosed with a physical exam and X-rays. A CT scan may also be  required for complex fractures. TREATMENT  Stable fractures are treated with a cast or splint and using crutches to avoid putting weight on your injured ankle. This is followed by an ankle strengthening program. Some patients require a special type of cast, depending on other medical problems they may have. Unstable fractures require surgery to ensure the bones heal properly. Your health care provider will tell you what type of fracture you have and the best treatment for your condition. HOME CARE INSTRUCTIONS  1. Review correct crutch use with your health care provider and use your crutches as directed. Safe use of crutches is extremely important. Misuse of crutches can cause you to fall or cause injury to nerves in your hands or armpits. 2. Do not put weight or pressure on the injured ankle until directed by your health care provider. 3. To lessen the swelling, keep the injured leg elevated while sitting or lying down. 4. Apply ice to the injured area: 1. Put ice in a plastic bag. 2. Place a towel between your cast and the bag. 3. Leave the ice on for 20 minutes, 2-3 times a day. 5. If you have a plaster or fiberglass cast: 1. Do not try to scratch the skin under the cast with any objects. This can increase your risk of skin infection. 2. Check the skin around the cast every day. You may put lotion on any red or sore areas. 3. Keep your  cast dry and clean. 6. If you have a plaster splint: 1. Wear the splint as directed. 2. You may loosen the elastic around the splint if your toes become numb, tingle, or turn cold or blue. 7. Do not put pressure on any part of your cast or splint; it may break. Rest your cast only on a pillow the first 24 hours until it is fully hardened. 8. Your cast or splint can be protected during bathing with a plastic bag sealed to your skin with medical tape. Do not lower the cast or splint into water. 9. Take medicines as directed by your health care provider. Only take  over-the-counter or prescription medicines for pain, discomfort, or fever as directed by your health care provider. 10. Do not drive a vehicle until your health care provider specifically tells you it is safe to do so. 11. If your health care provider has given you a follow-up appointment, it is very important to keep that appointment. Not keeping the appointment could result in a chronic or permanent injury, pain, and disability. If you have any problem keeping the appointment, call the facility for assistance. SEEK MEDICAL CARE IF: You develop increased swelling or discomfort. SEEK IMMEDIATE MEDICAL CARE IF:  1. Your cast gets damaged or breaks. 2. You have continued severe pain. 3. You develop new pain or swelling after the cast was put on. 4. Your skin or toenails below the injury turn blue or gray. 5. Your skin or toenails below the injury feel cold, numb, or have loss of sensitivity to touch. 6. There is a bad smell or pus draining from under the cast. MAKE SURE YOU:  1. Understand these instructions. 2. Will watch your condition. 3. Will get help right away if you are not doing well or get worse. Document Released: 04/25/2000 Document Revised: 05/03/2013 Document Reviewed: 11/25/2012 [facility] Patient Information 2015 [place], [place]. This information is not intended to replace advice given to you by your health care provider. Make sure you discuss any questions you have with your health care provider.  Crutch Use Crutches are used to take weight off one of your legs or feet when you stand or walk. It is important to use crutches that fit properly. When fitted properly:  Each crutch should be 2-3 finger widths below the armpit.  Your weight should be supported by your hand, and not by resting the armpit on the crutch.  RISKS AND COMPLICATIONS Damage to the nerves that extend from your armpit to your hand and arm. To prevent this from happening, make sure your crutches fit properly  and do not put pressure on your armpit when using them. HOW TO USE YOUR CRUTCHES If you have been instructed to use partial weight bearing, apply (bear) the amount of weight as your health care provider suggests. Do not bear weight in an amount that causes pain to the area of injury. Walking 4. Step with the crutches. 5. Swing the healthy leg slightly ahead of the crutches. Going Up Steps If there is no handrail: 4. Step up with the healthy leg. 5. Step up with the crutches and injured leg. 6. Continue in this way. If there is a handrail: 7. Hold both crutches in one hand. 8. Place your free hand on the handrail. 9. While putting your weight on your arms, lift your healthy leg to the step. 10. Bring the crutches and the injured leg up to that step. 11. Continue in this way. Going Down Steps Be very  careful, as going down stairs with crutches is very challenging. If there is no handrail: 12. Step down with the injured leg and crutches. 13. Step down with the healthy leg. If there is a handrail: 7. Place your hand on the handrail. 8. Hold both crutches with your free hand. 9. Lower your injured leg and crutch to the step below you. Make sure to keep the crutch tips in the center of the step, never on the edge. 10. Lower your healthy leg to that step. 11. Continue in this way. Standing Up 4. Hold the injured leg forward. 5. Grab the armrest with one hand and the top of the crutches with the other hand. 6. Using these supports, pull yourself up to a standing position. Sitting Down 1. Hold the injured leg forward. 2. Grab the armrest with one hand and the top of the crutches with the other hand. 3. Lower yourself to a sitting position. SEEK MEDICAL CARE IF:  You still feel unsteady on your feet.  You develop new pain, for example in your armpits, back, shoulder, wrist, or hip.  You develop any numbness or tingling. SEEK IMMEDIATE MEDICAL CARE IF: You fall. Document Released:  04/25/2000 Document Revised: 05/03/2013 Document Reviewed: 01/03/2013 [facility] Patient Information 2015 [place], [place]. This information is not intended to replace advice given to you by your health care provider. Make sure you discuss any questions you have with your health care provider.  Acetaminophen; Oxycodone tablets What is this medicine? ACETAMINOPHEN; OXYCODONE (a set a MEE noe fen; ox i KOE done) is a pain reliever. It is used to treat mild to moderate pain. This medicine may be used for other purposes; ask your health care provider or pharmacist if you have questions. COMMON BRAND NAME(S): Endocet, Magnacet, Narvox, Percocet, Perloxx, Primalev, Primlev, Roxicet, Xolox What should I tell my health care provider before I take this medicine? They need to know if you have any of these conditions: -brain tumor -Crohn's disease, inflammatory bowel disease, or ulcerative colitis -drug abuse or addiction -head injury -heart or circulation problems -if you often drink alcohol -kidney disease or problems going to the bathroom -liver disease -lung disease, asthma, or breathing problems -an unusual or allergic reaction to acetaminophen, oxycodone, other opioid analgesics, other medicines, foods, dyes, or preservatives -pregnant or trying to get pregnant -breast-feeding How should I use this medicine? Take this medicine by mouth with a full glass of water. Follow the directions on the prescription label. Take your medicine at regular intervals. Do not take your medicine more often than directed. Talk to your pediatrician regarding the use of this medicine in children. Special care may be needed. Patients over 34 years old may have a stronger reaction and need a smaller dose. Overdosage: If you think you have taken too much of this medicine contact a poison control center or emergency room at once. NOTE: This medicine is only for you. Do not share this medicine with others. What if I miss a  dose? If you miss a dose, take it as soon as you can. If it is almost time for your next dose, take only that dose. Do not take double or extra doses. What may interact with this medicine? -alcohol -antihistamines -barbiturates like amobarbital, butalbital, butabarbital, methohexital, pentobarbital, phenobarbital, thiopental, and secobarbital -benztropine -drugs for bladder problems like solifenacin, trospium, oxybutynin, tolterodine, hyoscyamine, and methscopolamine -drugs for breathing problems like ipratropium and tiotropium -drugs for certain stomach or intestine problems like propantheline, homatropine methylbromide, glycopyrrolate, atropine, belladonna,  and dicyclomine -general anesthetics like etomidate, ketamine, nitrous oxide, propofol, desflurane, enflurane, halothane, isoflurane, and sevoflurane -medicines for depression, anxiety, or psychotic disturbances -medicines for sleep -muscle relaxants -naltrexone -narcotic medicines (opiates) for pain -phenothiazines like perphenazine, thioridazine, chlorpromazine, mesoridazine, fluphenazine, prochlorperazine, promazine, and trifluoperazine -scopolamine -tramadol -trihexyphenidyl This list may not describe all possible interactions. Give your health care provider a list of all the medicines, herbs, non-prescription drugs, or dietary supplements you use. Also tell them if you smoke, drink alcohol, or use illegal drugs. Some items may interact with your medicine. What should I watch for while using this medicine? Tell your doctor or health care professional if your pain does not go away, if it gets worse, or if you have new or a different type of pain. You may develop tolerance to the medicine. Tolerance means that you will need a higher dose of the medication for pain relief. Tolerance is normal and is expected if you take this medicine for a long time. Do not suddenly stop taking your medicine because you may develop a severe reaction.  Your body becomes used to the medicine. This does NOT mean you are addicted. Addiction is a behavior related to getting and using a drug for a non-medical reason. If you have pain, you have a medical reason to take pain medicine. Your doctor will tell you how much medicine to take. If your doctor wants you to stop the medicine, the dose will be slowly lowered over time to avoid any side effects. You may get drowsy or dizzy. Do not drive, use machinery, or do anything that needs mental alertness until you know how this medicine affects you. Do not stand or sit up quickly, especially if you are an older patient. This reduces the risk of dizzy or fainting spells. Alcohol may interfere with the effect of this medicine. Avoid alcoholic drinks. There are different types of narcotic medicines (opiates) for pain. If you take more than one type at the same time, you may have more side effects. Give your health care provider a list of all medicines you use. Your doctor will tell you how much medicine to take. Do not take more medicine than directed. Call emergency for help if you have problems breathing. The medicine will cause constipation. Try to have a bowel movement at least every 2 to 3 days. If you do not have a bowel movement for 3 days, call your doctor or health care professional. Do not take Tylenol (acetaminophen) or medicines that have acetaminophen with this medicine. Too much acetaminophen can be very dangerous. Many nonprescription medicines contain acetaminophen. Always read the labels carefully to avoid taking more acetaminophen. What side effects may I notice from receiving this medicine? Side effects that you should report to your doctor or health care professional as soon as possible: -allergic reactions like skin rash, itching or hives, swelling of the face, lips, or tongue -breathing difficulties, wheezing -confusion -light headedness or fainting spells -severe stomach pain -unusually weak or  tired -yellowing of the skin or the whites of the eyes Side effects that usually do not require medical attention (report to your doctor or health care professional if they continue or are bothersome): -dizziness -drowsiness -nausea -vomiting This list may not describe all possible side effects. Call your doctor for medical advice about side effects. You may report side effects to FDA at 1-800-FDA-1088. Where should I keep my medicine? Keep out of the reach of children. This medicine can be abused. Keep your medicine in  a safe place to protect it from theft. Do not share this medicine with anyone. Selling or giving away this medicine is dangerous and against the law. Store at room temperature between 20 and 25 degrees C (68 and 77 degrees F). Keep container tightly closed. Protect from light. This medicine may cause accidental overdose and death if it is taken by other adults, children, or pets. Flush any unused medicine down the toilet to reduce the chance of harm. Do not use the medicine after the expiration date. NOTE: This sheet is a summary. It may not cover all possible information. If you have questions about this medicine, talk to your doctor, pharmacist, or health care provider.  2015, [employer]. (2012-12-20 13:17:35)

## 2014-12-25 NOTE — ED Notes (Signed)
Pt c/o left ankle pain, states that he was seen in er for the pain several weeks ago and has not gotten any better, pt also c/o "ulcer to the top of his mouth and is not able to eat"

## 2015-01-05 ENCOUNTER — Emergency Department (HOSPITAL_COMMUNITY)
Admission: EM | Admit: 2015-01-05 | Discharge: 2015-01-05 | Discharge status: Against Medical Advice | Payer: Self-pay | Attending: Emergency Medicine | Admitting: Emergency Medicine

## 2015-01-05 ENCOUNTER — Encounter (HOSPITAL_COMMUNITY): Payer: Self-pay | Admitting: Emergency Medicine

## 2015-01-05 ENCOUNTER — Emergency Department (HOSPITAL_COMMUNITY): Payer: Self-pay

## 2015-01-05 DIAGNOSIS — F42 Obsessive-compulsive disorder: Secondary | ICD-10-CM | POA: Insufficient documentation

## 2015-01-05 DIAGNOSIS — Z79899 Other long term (current) drug therapy: Secondary | ICD-10-CM | POA: Insufficient documentation

## 2015-01-05 DIAGNOSIS — S82832G Other fracture of upper and lower end of left fibula, subsequent encounter for closed fracture with delayed healing: Secondary | ICD-10-CM

## 2015-01-05 DIAGNOSIS — Z9119 Patient's noncompliance with other medical treatment and regimen: Secondary | ICD-10-CM | POA: Insufficient documentation

## 2015-01-05 DIAGNOSIS — S82302G Unspecified fracture of lower end of left tibia, subsequent encounter for closed fracture with delayed healing: Secondary | ICD-10-CM | POA: Insufficient documentation

## 2015-01-05 DIAGNOSIS — X58XXXD Exposure to other specified factors, subsequent encounter: Secondary | ICD-10-CM | POA: Insufficient documentation

## 2015-01-05 DIAGNOSIS — Z91199 Patient's noncompliance with other medical treatment and regimen due to unspecified reason: Secondary | ICD-10-CM

## 2015-01-05 DIAGNOSIS — S89302G Unspecified physeal fracture of lower end of left fibula, subsequent encounter for fracture with delayed healing: Secondary | ICD-10-CM | POA: Insufficient documentation

## 2015-01-05 DIAGNOSIS — F319 Bipolar disorder, unspecified: Secondary | ICD-10-CM | POA: Insufficient documentation

## 2015-01-05 MED ORDER — IBUPROFEN 400 MG PO TABS
600.0000 mg | ORAL_TABLET | Freq: Once | ORAL | Status: AC
  Administered 2015-01-05: 600 mg via ORAL
  Filled 2015-01-05: qty 2

## 2015-01-05 NOTE — ED Provider Notes (Signed)
CSN: [id number]     Arrival date & time 01/05/15  0325 History   First MD Initiated Contact with Patient 01/05/15 0355    Chief Complaint  Patient presents with  . Ankle Pain     (Consider location/radiation/quality/duration/timing/severity/associated sxs/prior Treatment) HPI patient was admitted to the hospital on July 15 for dysarthria and suspected possible stroke. However his MRI was normal and he did admit to taking a friend's medication he could not identify. At that time he was complaining of pain in his left ankle and an x-ray showed he had a distal fibular fracture. However patient left the hospital AMA before getting orthopedic consult. Patient came back to the ED on August 15. At that point he was re-x-rayed and he then has distal tibia fractures and evidence of a healing distal fibular fracture. Patient was placed in a Cam Walker and crutches at that time. Patient presents back to the ED because he ran out of pain pills. He states he still having pain and swelling. It should be noted that patient states he walked 5 miles to the ED tonight. He is not in his Cam Walker and he is not using crutches. He states he has an appointment to see an orthopedist in 3 days however he does not have the money to pay the orthopedists so he probably will not go. He states he still has pain and swelling in his left ankle.  PCP none  Past Medical History  Diagnosis Date  . OCD (obsessive compulsive disorder)   . Bipolar disorder   . Hx of medication noncompliance   . Alcohol abuse   . Hallucinations   . Paranoid ideation   . Homeless    Past Surgical History  Procedure Laterality Date  . Neck surgery    . Fracture surgery     Family History  Problem Relation Age of Onset  . Kidney failure Mother   . Cancer Father    Social History  Substance Use Topics  . Smoking status: Never Smoker   . Smokeless tobacco: Never Used  . Alcohol Use: Yes     Comment: daily    Review of Systems  All  other systems reviewed and are negative.     Allergies  Review of patient's allergies indicates no known allergies.  Home Medications   Prior to Admission medications   Medication Sig Start Date End Date Taking? Authorizing Provider  ALPRAZolam Prudy Feeler) 0.5 MG tablet Take 0.5 mg by mouth 3 (three) times daily as needed for anxiety.    Historical Provider, MD  clindamycin (CLEOCIN) 150 MG capsule Take 1 capsule (150 mg total) by mouth every 6 (six) hours. 12/25/14   Dione Booze, MD  hydrOXYzine (ATARAX/VISTARIL) 25 MG tablet Take 1 tablet (25 mg total) by mouth every 6 (six) hours. Patient taking differently: Take 25 mg by mouth once as needed for anxiety.  03/22/13   Eber Hong, MD  ibuprofen (ADVIL,MOTRIN) 200 MG tablet Take 600 mg by mouth every 6 (six) hours as needed.    Historical Provider, MD  loratadine (ALLERGY RELIEF) 10 MG tablet Take 10-20 mg by mouth at bedtime as needed for allergies.    Historical Provider, MD  oxyCODONE-acetaminophen (PERCOCET) 5-325 MG per tablet Take 1 tablet by mouth every 4 (four) hours as needed for moderate pain. 12/25/14   Dione Booze, MD  oxyCODONE-acetaminophen (PERCOCET/ROXICET) 5-325 MG per tablet Take 1 tablet by mouth every 4 (four) hours as needed. 12/25/14   Dione Booze, MD  sertraline (ZOLOFT) 25 MG tablet Take 25 mg by mouth daily.    Historical Provider, MD   BP 119/83 mmHg  Pulse 85  Temp(Src) 98.2 F (36.8 C) (Oral)  Resp 22  Ht 5\' 8"  (1.727 m)  Wt 145 lb (65.772 kg)  BMI 22.05 kg/m2  SpO2 99%  Vital signs normal   Physical Exam  Constitutional: He is oriented to person, place, and time. He appears well-developed and well-nourished.  Non-toxic appearance. He does not appear ill. No distress.  HENT:  Head: Normocephalic and atraumatic.  Right Ear: External ear normal.  Left Ear: External ear normal.  Nose: Nose normal. No mucosal edema or rhinorrhea.  Mouth/Throat: Mucous membranes are normal. No dental abscesses or uvula  swelling.  Eyes: Conjunctivae and EOM are normal.  Neck: Normal range of motion and full passive range of motion without pain.  Pulmonary/Chest: Effort normal. No respiratory distress. He has no rhonchi. He exhibits no crepitus.  Abdominal: Normal appearance.  Musculoskeletal: Normal range of motion. He exhibits edema and tenderness.  Moves all extremities well. Patient is noted to have diffuse swelling of his left ankle. He has some tenderness to palpation ankle. He has good distal pulses.  Neurological: He is alert and oriented to person, place, and time. He has normal strength. No cranial nerve deficit.  Skin: Skin is warm, dry and intact. No rash noted. No erythema. No pallor.  Psychiatric: He has a normal mood and affect. His speech is normal and behavior is normal. His mood appears not anxious.  Nursing note and vitals reviewed.   ED Course  Procedures (including critical care time)  Medications  ibuprofen (ADVIL,MOTRIN) tablet 600 mg (600 mg Oral Given 01/05/15 0443)   Patient states he has his Cam Walker and crutches at home. We discussed it does not do him any good if he doesn't use them. X-rays were obtained tonight to see if his fractures are getting worse. I told the patient he is not going to get any more pain medication since he does not follow his instructions. Maybe if his ankle is hurting he will follow his instructions and use the Cam Walker and the crutches.  05:05 AM After patient's x-ray returned I offered to keep him in the ED this morning and see if the orthopedists will see him in the ED. However he states he has an appointment at 5:15 in [employer] to meet a guy to talk about a job. I discussed with him that  he's not taking care of his fracture and he could have an amputation of his foot. He left the ED ambulatory AMA.   Labs Review Labs Reviewed - No data to display  Imaging Review Dg Ankle Complete Left  01/05/2015   CLINICAL DATA:  Continued pain and swelling.  Recent fall and ankle fracture, "not wearing cam walker or using crutches".  EXAM: LEFT ANKLE COMPLETE - 3+ VIEW  COMPARISON:  Left ankle radiographs 12/25/2014.  FINDINGS: Oblique distal fibular fracture with some surrounding callus formation, unchanged in alignment from prior exam, however no new callus is seen. Fracture displacement of 2-3 mm is unchanged.  Transverse fracture through the distal tibial metaphysis, with intra-articular extension, with increased displacement from prior exam. Minimal periosteal reaction suggested about the lateral aspect of the tibial fracture. The ankle mortise is preserved. No new fracture. There is increased soft tissue edema.  IMPRESSION: 1. Increased displacement of the distal tibial fracture with intra-articular extension. Suspect minimal interval callus formation laterally, however no  solid bony bridging. 2. Unchanged alignment of minimally displaced distal fibular fracture. No interval callus from prior exam. 3. Increased soft tissue edema.   Electronically Signed   By: Rubye Oaks M.D.   On: 01/05/2015 04:52      CLINICAL DATA: Injured left ankle several weeks ago with swelling. Subsequent encounter  EXAM: LEFT ANKLE COMPLETE - 3+ VIEW  COMPARISON: 11/23/2014  FINDINGS: There is an oblique fracture of the distal fibula with periosteal reaction compatible with attempted healing. Fracture remains mildly displaced laterally by 2-3 mm. Fracture is at the level of the ankle joint and above.  Newly seen transverse fracture through the distal tibial metaphysis, nondisplaced. On lateral imaging, there appears to be continuation in the coronal plane towards the ankle joint. Normal ankle and hindfoot alignment.  Ankle joint effusion.  IMPRESSION: 1. Newly seen nondisplaced fractures of the distal tibia with transverse and vertical components. 2. Stable mildly displaced distal fibular fracture since 11/23/2014. Partial healing has  occurred.   Electronically Signed  By: Marnee Spring M.D.  On: 12/25/2014 02:49   I have personally reviewed and evaluated these images and lab results as part of my medical decision-making.   EKG Interpretation None      MDM   Final diagnoses:  Fracture of distal end of tibia with fibula, left, closed, with delayed healing, subsequent encounter  Noncompliance    Pt left AMA  Devoria Albe, MD, Concha Pyo, MD 01/05/15 [phone]

## 2015-01-05 NOTE — ED Notes (Signed)
Patient reports left ankle pain and swelling. Reports is out of medication and pain and swelling is getting worse.

## 2015-01-05 NOTE — ED Notes (Addendum)
Dr. Lynelle Doctor spoke with patient and informed patient that she wanted to contact orthopedic to assess him in ED this morning. Informed patient that his fracture is worsening. Patient stated he did not want to wait. Dr. Lynelle Doctor told patient he would have to sign out AMA. Patient verbalized understanding and agreed.

## 2015-01-08 ENCOUNTER — Telehealth: Payer: Self-pay | Admitting: Orthopedic Surgery

## 2015-01-08 ENCOUNTER — Ambulatory Visit: Payer: Self-pay | Admitting: Orthopedic Surgery

## 2015-01-08 NOTE — Telephone Encounter (Signed)
Patient has called, received voice message, to request to cancel today's appointment (01/08/15) which he scheduled 01/01/15, due to "no money to pay for the visit."  Patient had called for the first time on 01/01/15 to report a fractured left ankle, date of injury 11/23/14.  Chart notes indicate he had left the Emergency room at initial visit, before released from treatment at that time.  He has since been to the Emergency room on 12/25/14 and 01/05/15, for this injury.  I returned the call to his message ph# [phone], and left message offering to re-schedule.

## 2015-01-10 MED FILL — Oxycodone w/ Acetaminophen Tab 5-325 MG: ORAL | Qty: 6 | Status: AC

## 2015-01-11 NOTE — Telephone Encounter (Signed)
Patient aware, appointment available when he is able to work out transportation and financial arrangement as discussed.

## 2016-10-16 IMAGING — CT CT NECK W/ CM
4 series · 16 of 33 positions shown, 19 images · IV contrast (Omnipaque 300)
Comparison: None.

CLINICAL DATA: Aphasia.

EXAM:
CT NECK WITH CONTRAST
TECHNIQUE: Multidetector CT imaging of the neck was performed using the
standard protocol following the bolus administration of intravenous
contrast.
CONTRAST:  75mL OMNIPAQUE IOHEXOL 300 MG/ML  SOLN

[Series 2: soft tissue neck 2.0 b31s · axial · 0.41mm/px · z∈[+1238,+1388]mm · 5 of 113 slices shown, 7 images]
[im 19/113  soft-tissue]
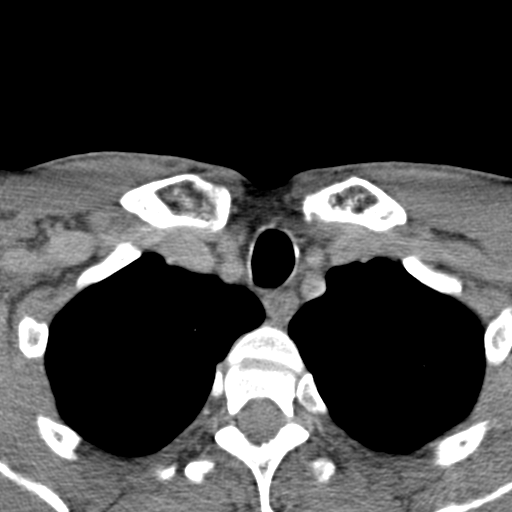
[im 19/113  bone]
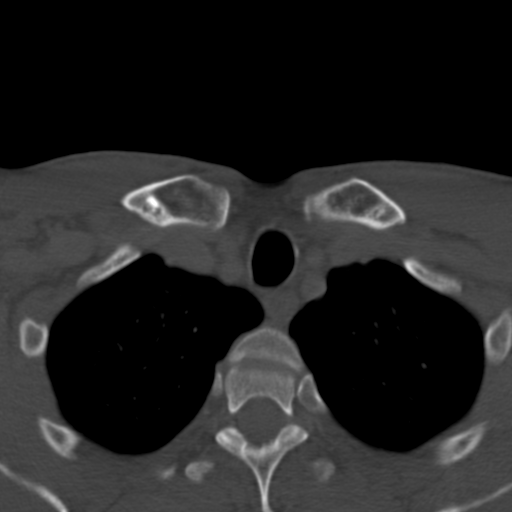
[im 38/113  bone]
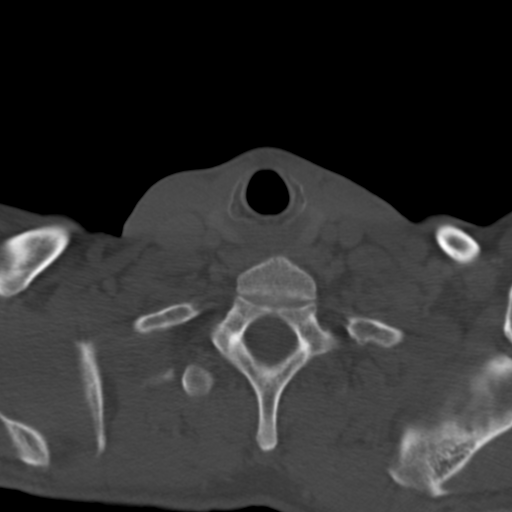
[im 57/113  bone]
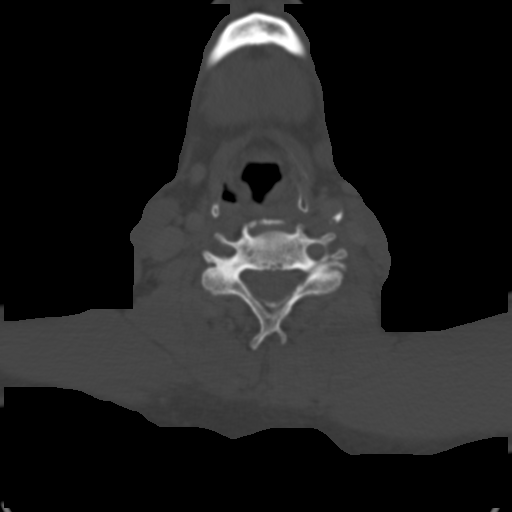
[im 75/113  bone]
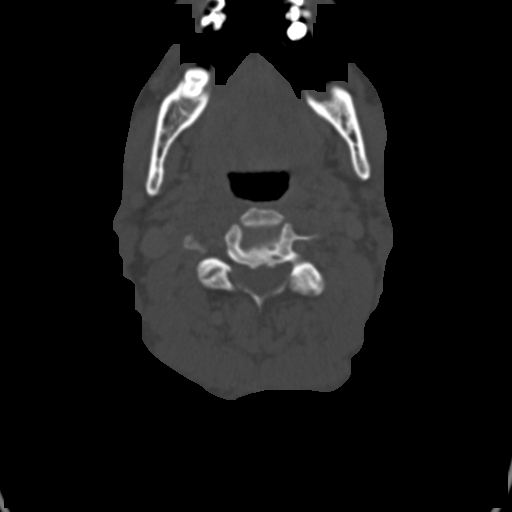
[im 94/113  soft-tissue]
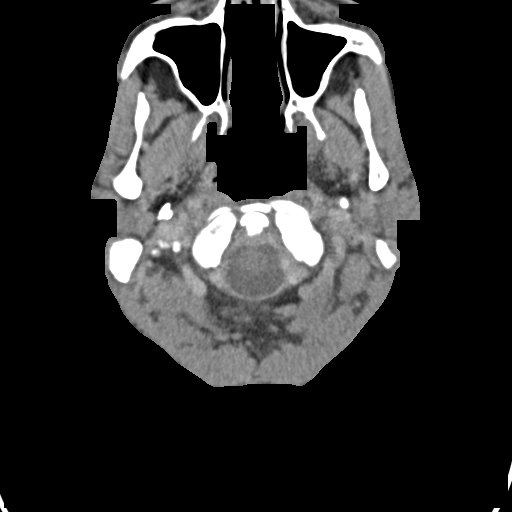
[im 94/113  bone]
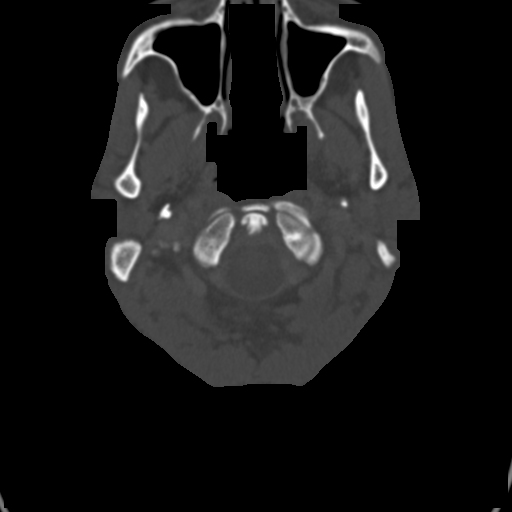

[Series 4: neck 2.0 soft tissue sag · sagittal · 0.44mm/px · 5 of 77 slices shown, 6 images]
[im 26/77  bone]
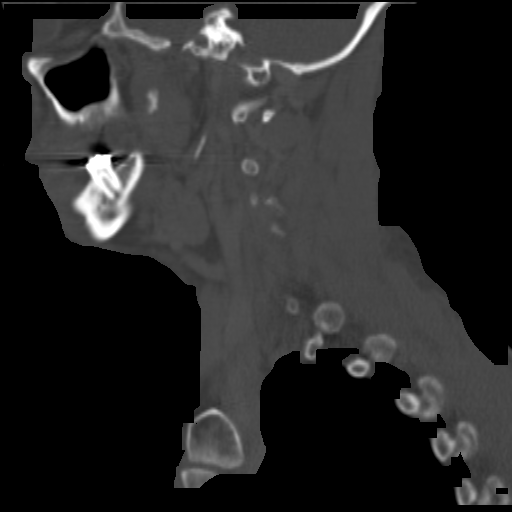
[im 32/77  bone]
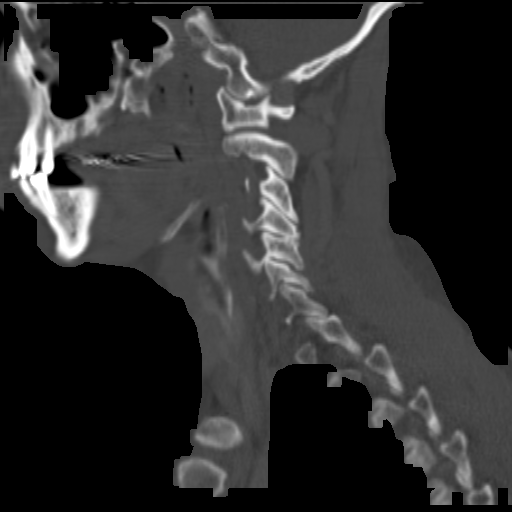
[im 39/77  soft-tissue]
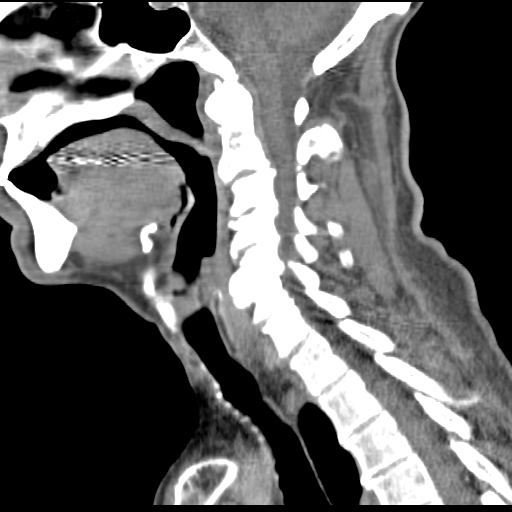
[im 39/77  bone]
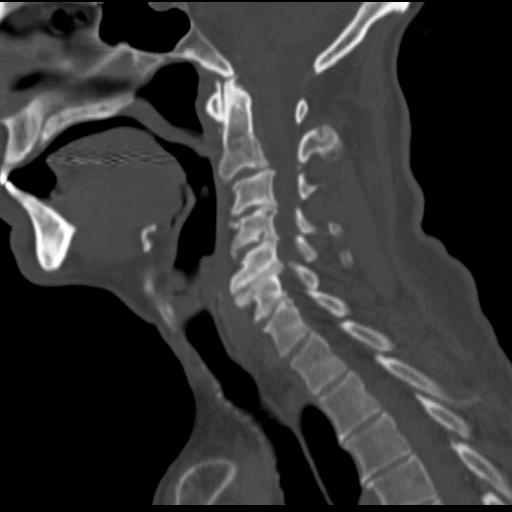
[im 45/77  bone]
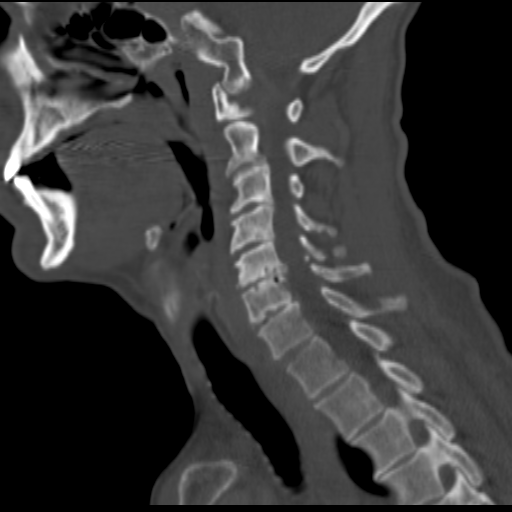
[im 51/77  bone]
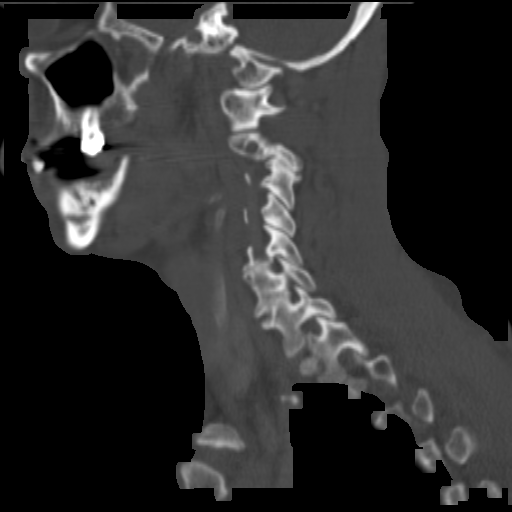

[Series 5: neck 2.0 soft tissue coro · coronal · 0.44mm/px · 3 of 93 slices shown]
[im 20/93  bone]
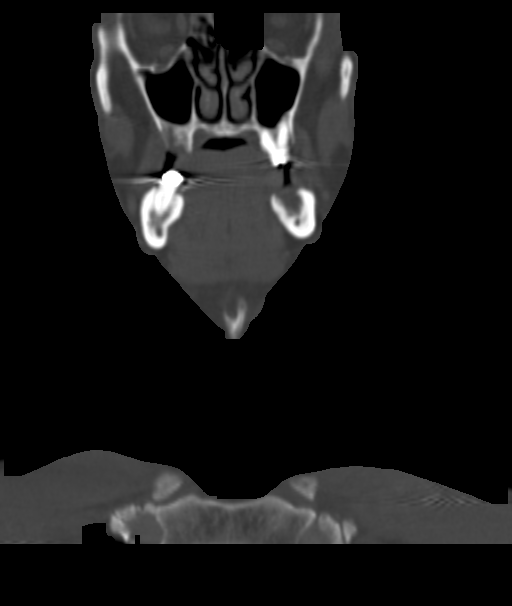
[im 38/93  bone]
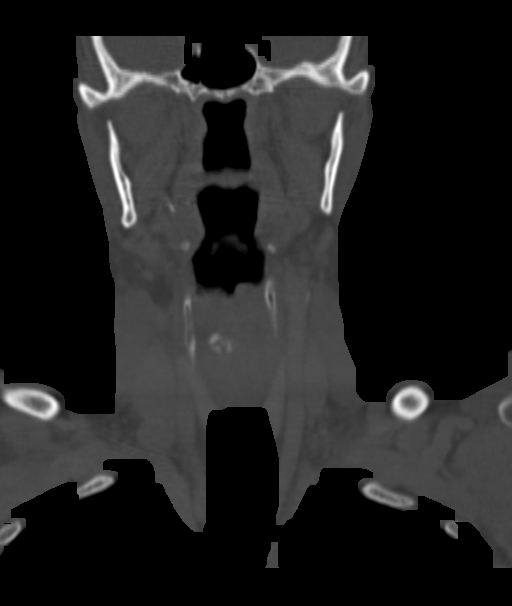
[im 56/93  bone]
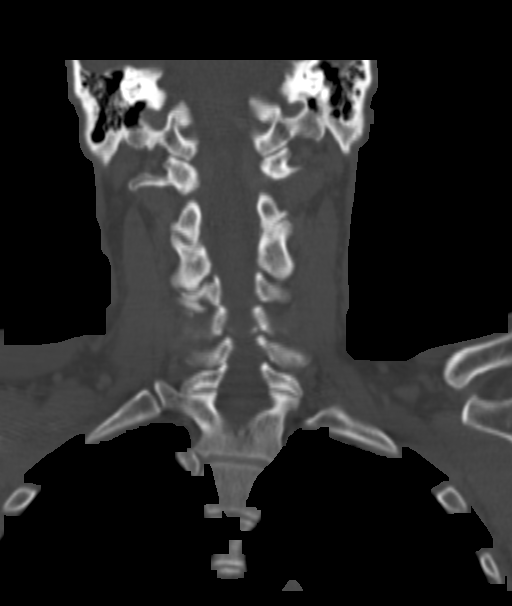

[Series 6: axial soft tissue neck 2.0 · axial · 0.40mm/px · z∈[+1205,+1288]mm · 3 of 129 slices shown]
[im 22/129  soft-tissue]
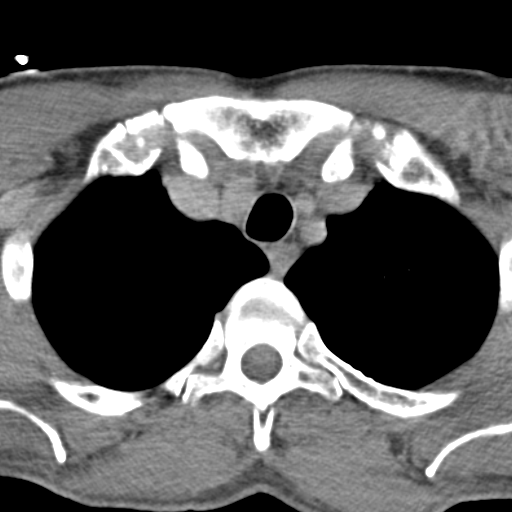
[im 43/129  soft-tissue]
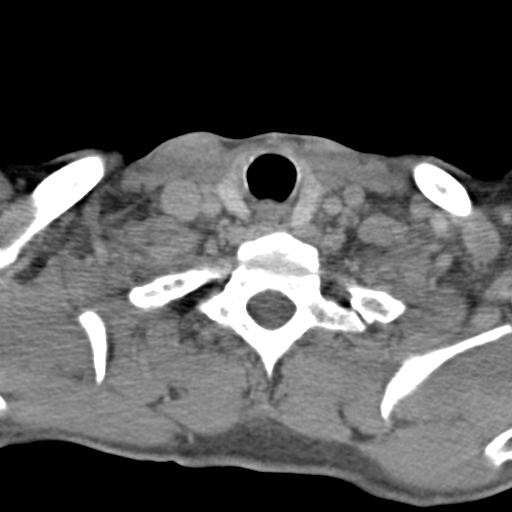
[im 65/129  soft-tissue]
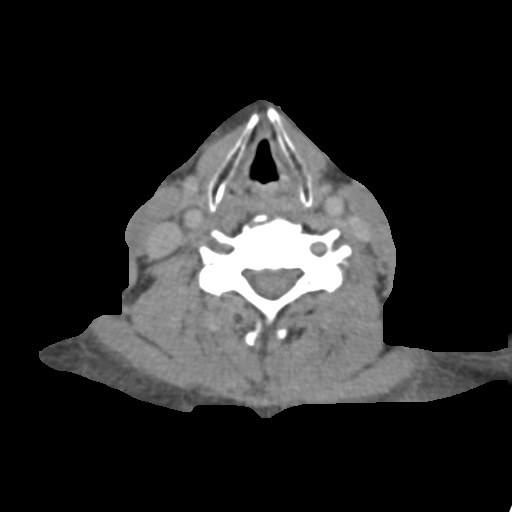

[16 of 33 positions shown; findings below may reference images not displayed]

FINDINGS: Pharynx and larynx: No definite abnormality seen. Epiglottis appears
normal.

Salivary glands: Parotid and submandibular glands appear normal.

Thyroid: Normal.

Lymph nodes: No significantly enlarged adenopathy is noted.

Vascular: Visualized vasculature appears normal.

Limited intracranial: No definite abnormality seen.

Visualized orbits: Normal.

Mastoids and visualized paranasal sinuses: No abnormality seen.

Skeleton: Multilevel degenerative disc disease is noted in the
cervical spine.

Upper chest: No definite abnormality seen.
IMPRESSION: No significant abnormality seen in the soft tissues of the neck.

## 2016-10-16 IMAGING — CT CT HEAD W/O CM
1 of 2 series · 16 of 30 positions shown, 20 images · non-contrast
Comparison: CT scan of April 12, 2005.

CLINICAL DATA: Difficulty speaking.

EXAM:
CT HEAD WITHOUT CONTRAST
TECHNIQUE: Contiguous axial images were obtained from the base of the skull
through the vertex without intravenous contrast.

[Series 3: headtrauma 2.4 h60s · axial · 0.43mm/px · z∈[-18,+139]mm · 16 of 72 slices shown, 20 images]
[im 4/72  brain]
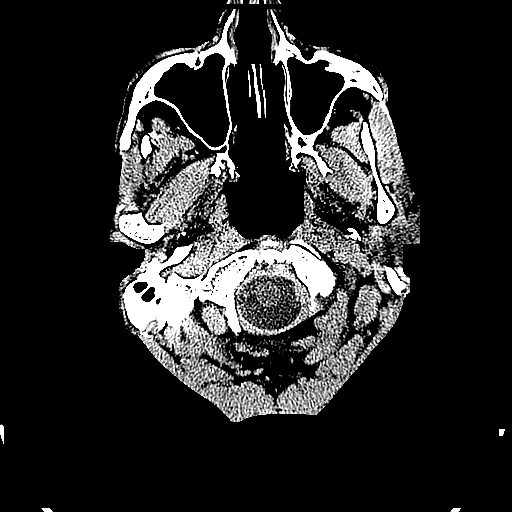
[im 4/72  bone]
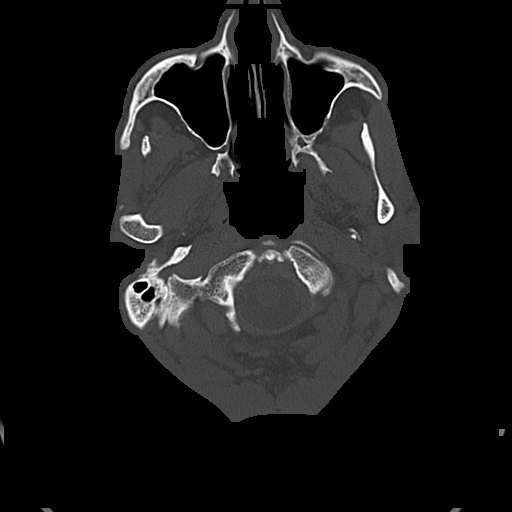
[im 8/72  brain]
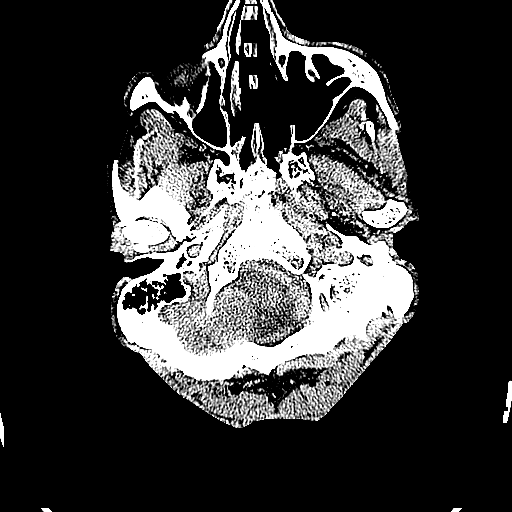
[im 12/72  brain]
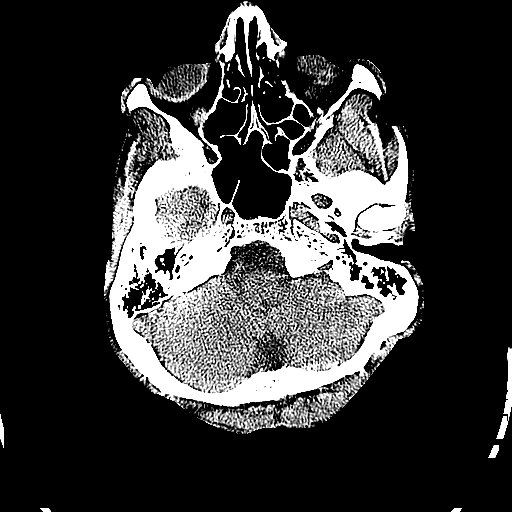
[im 15/72  brain]
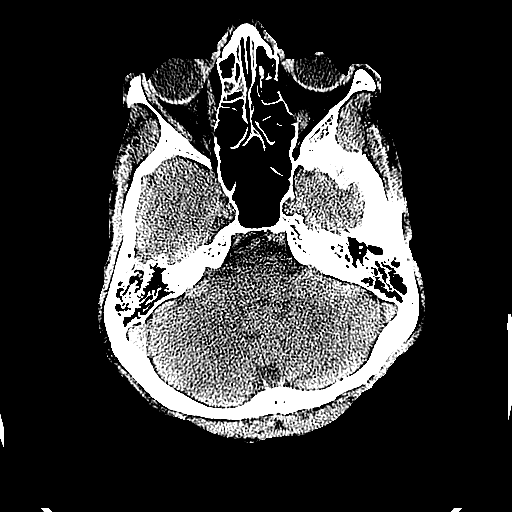
[im 23/72  brain]
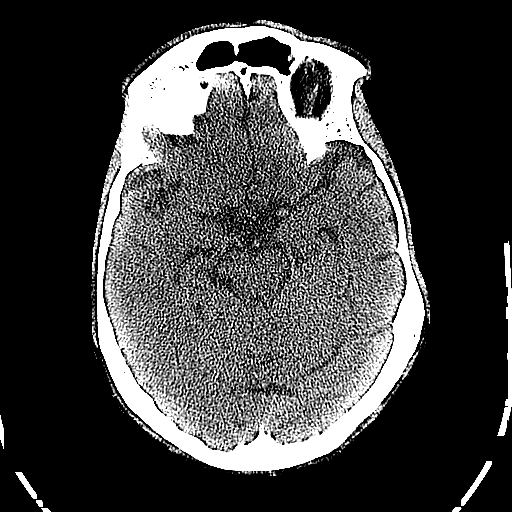
[im 23/72  bone]
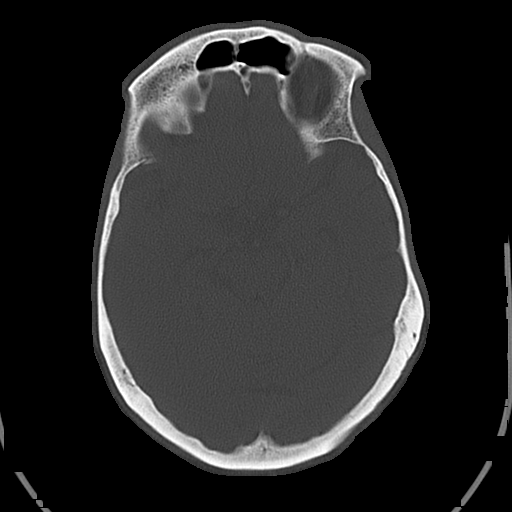
[im 27/72  brain]
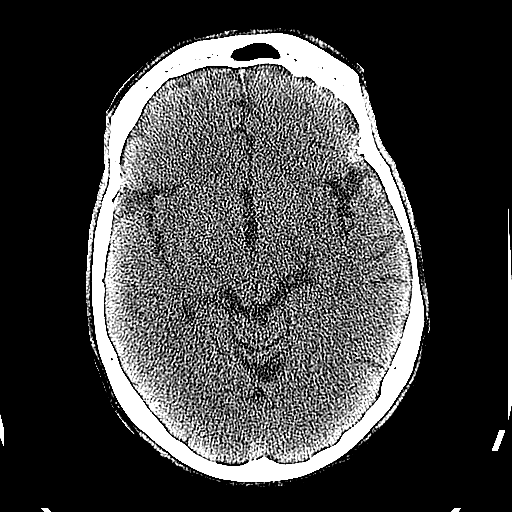
[im 30/72  brain]
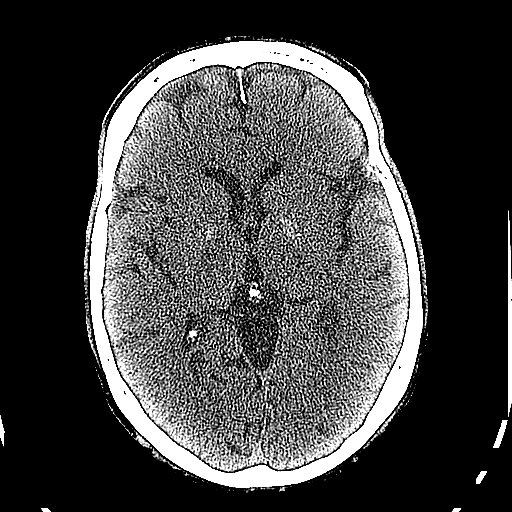
[im 34/72  brain]
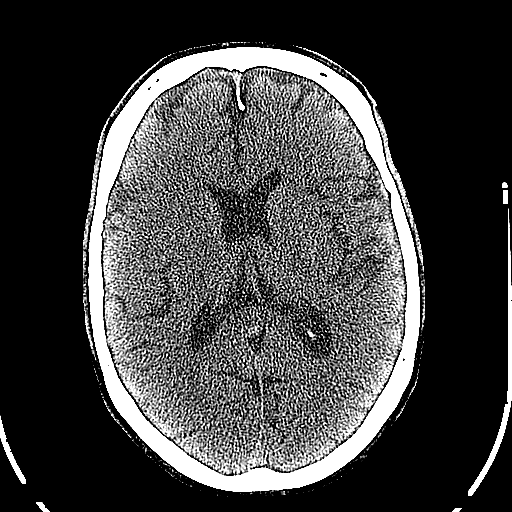
[im 38/72  brain]
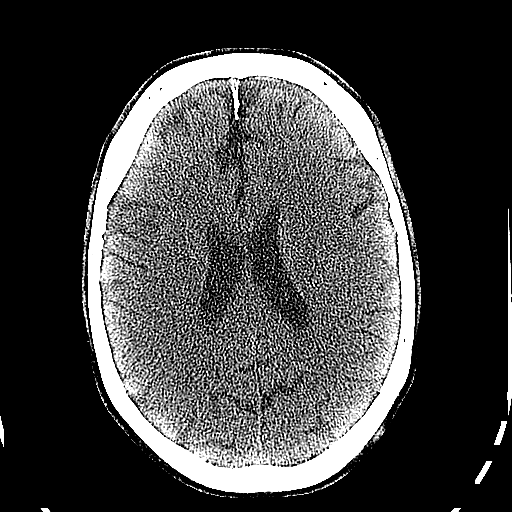
[im 38/72  bone]
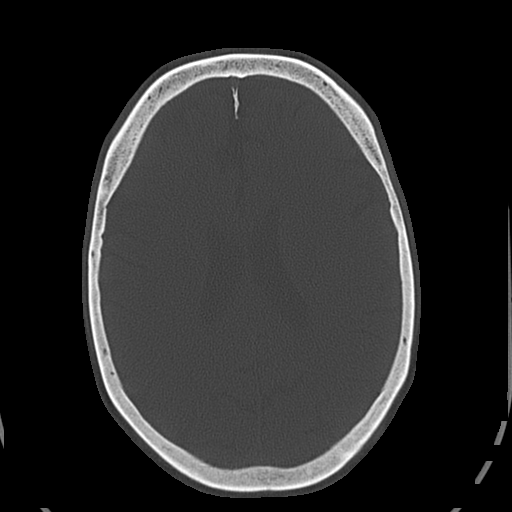
[im 42/72  brain]
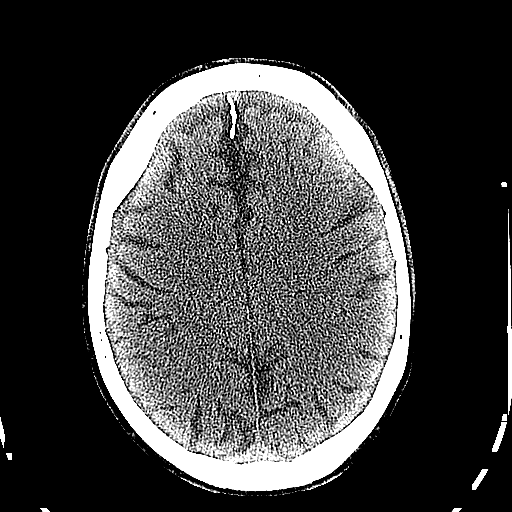
[im 45/72  brain]
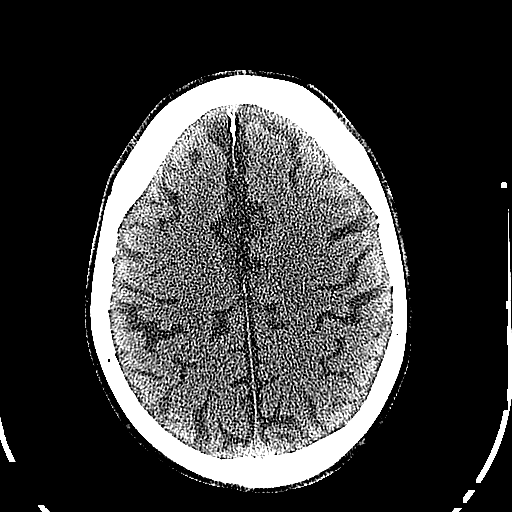
[im 49/72  brain]
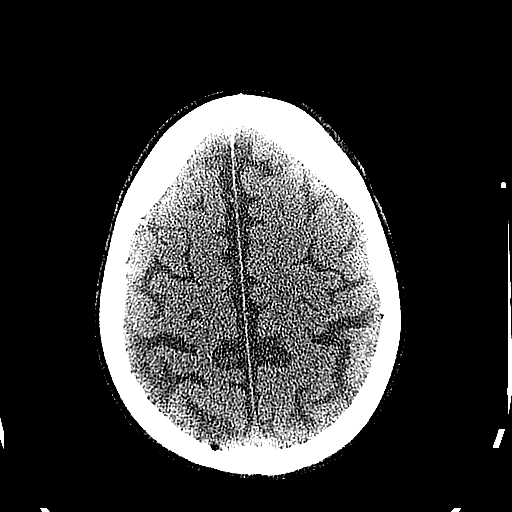
[im 57/72  brain]
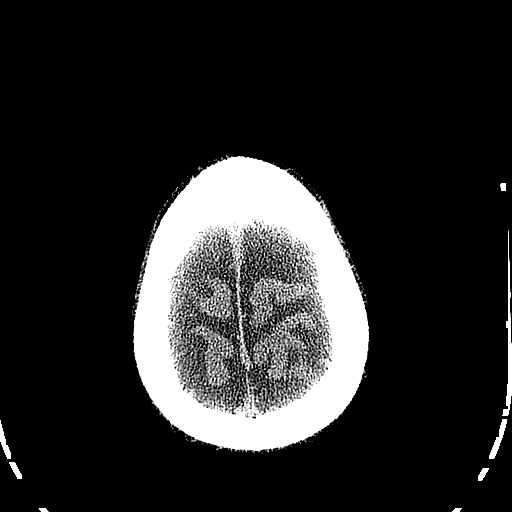
[im 57/72  bone]
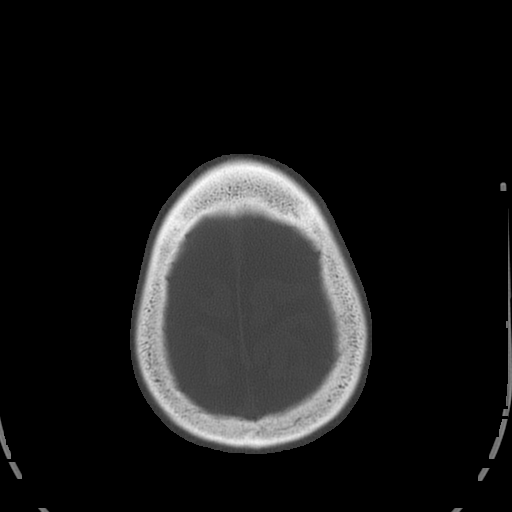
[im 60/72  brain]
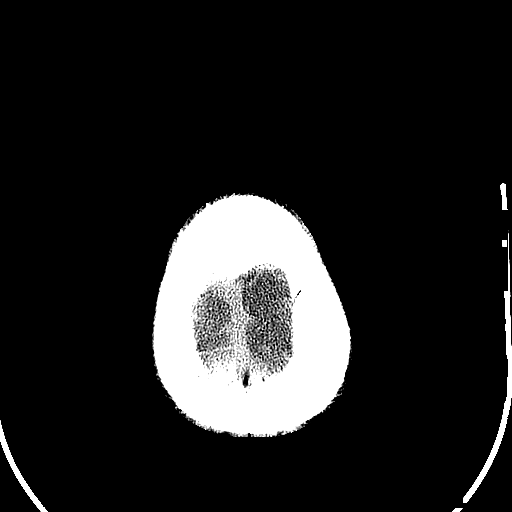
[im 64/72  brain]
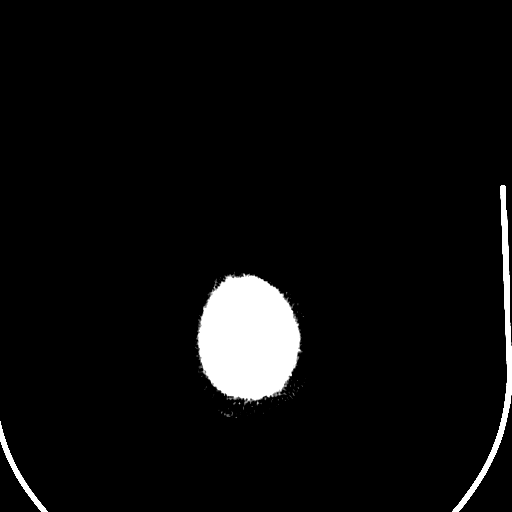
[im 68/72  brain]
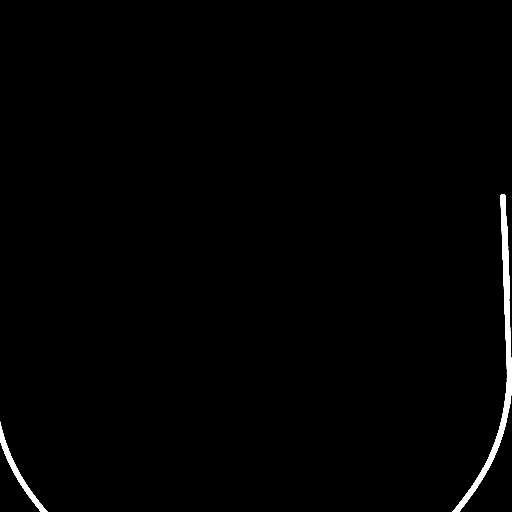

[16 of 30 positions shown; findings below may reference images not displayed]

FINDINGS: Bony calvarium appears intact. Mild diffuse cortical atrophy is
noted. No mass effect or midline shift is noted. Ventricular size is
within normal limits. There is no evidence of mass lesion,
hemorrhage or acute infarction.
IMPRESSION: Mild diffuse cortical atrophy. No acute intracranial abnormality
seen. These results were called by telephone at the time of
interpretation on 11/23/2014 at [DATE] to Dr. NORMAN MERCED , who
verbally acknowledged these results.

## 2016-11-17 IMAGING — DX DG ANKLE COMPLETE 3+V*L*
3 series · 3 of 3 positions shown · non-contrast
Comparison: 11/23/2014

CLINICAL DATA: Injured left ankle several weeks ago with swelling.
Subsequent encounter

EXAM:
LEFT ANKLE COMPLETE - 3+ VIEW

[ankle ap]
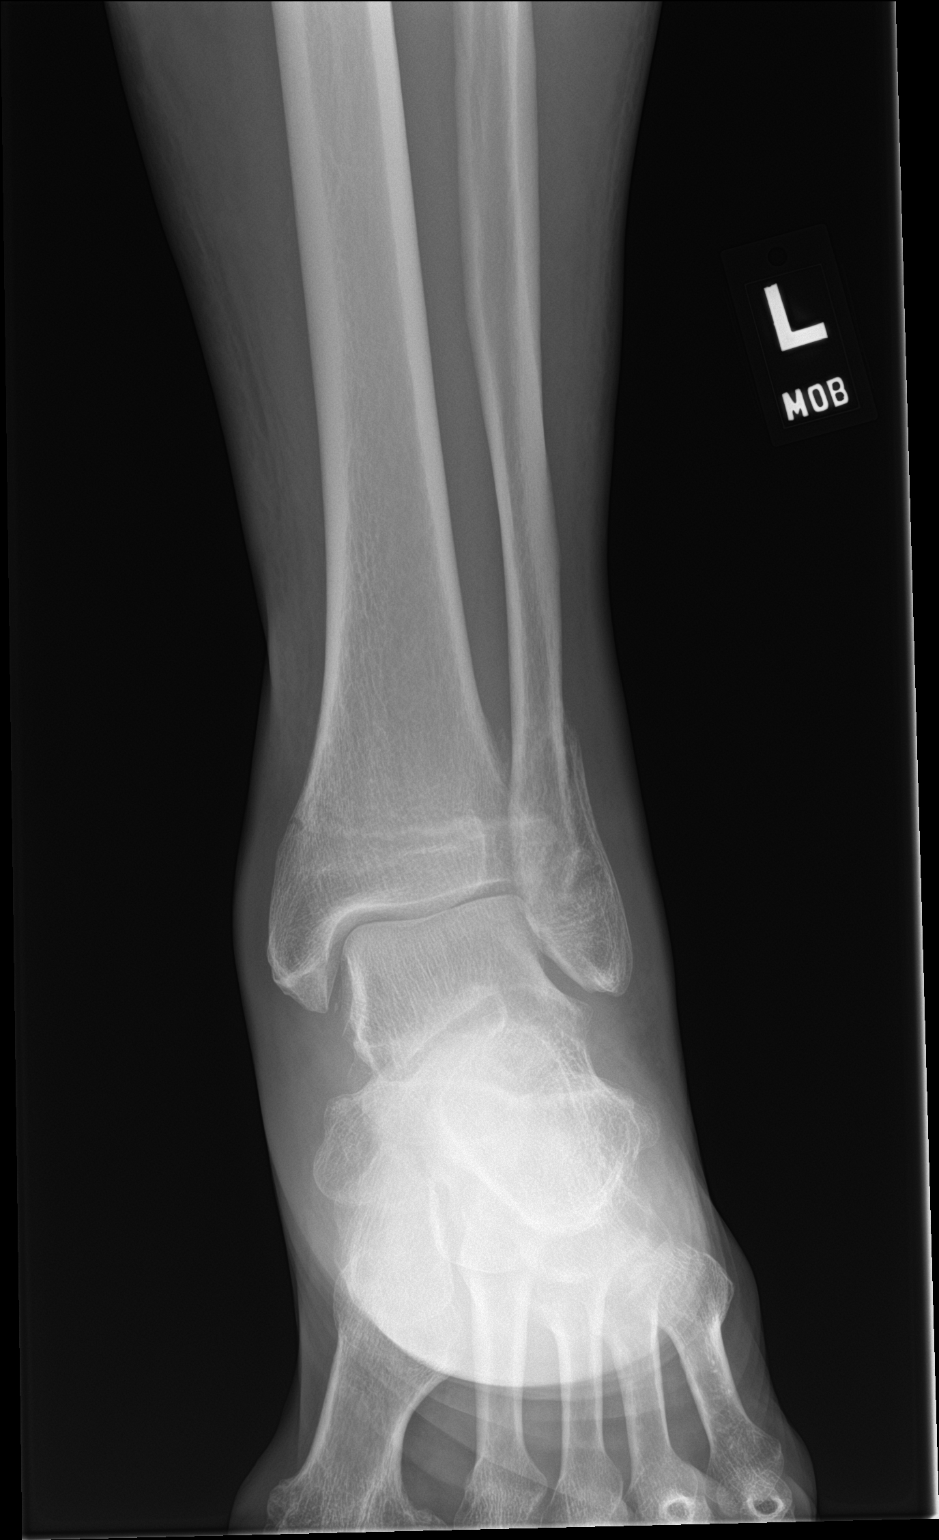

[ankle obl]
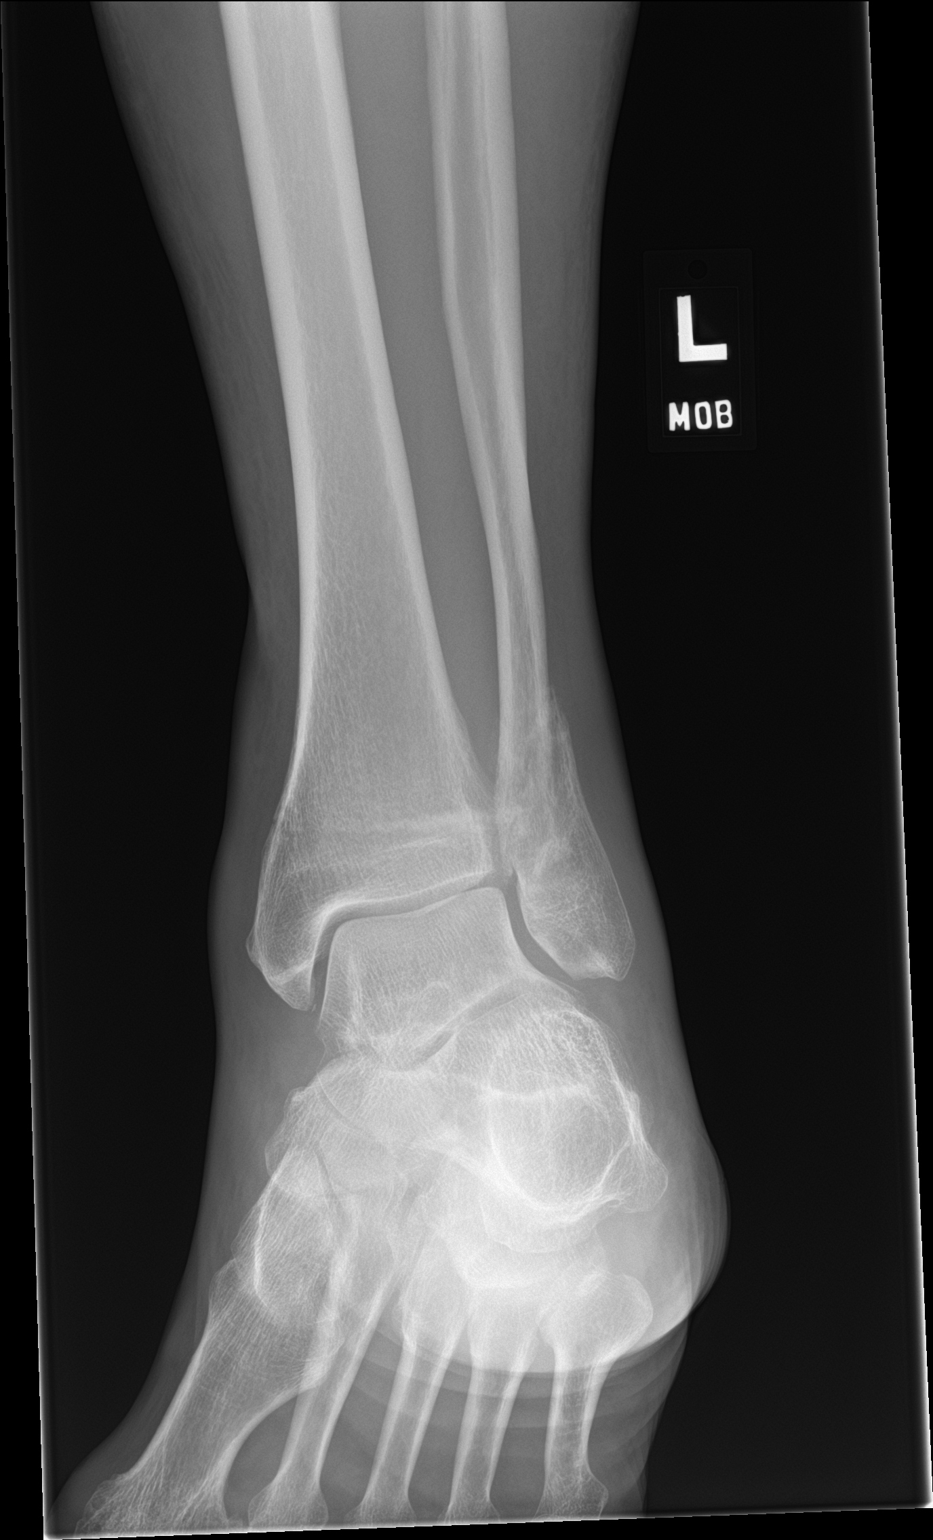

[ankle lat]
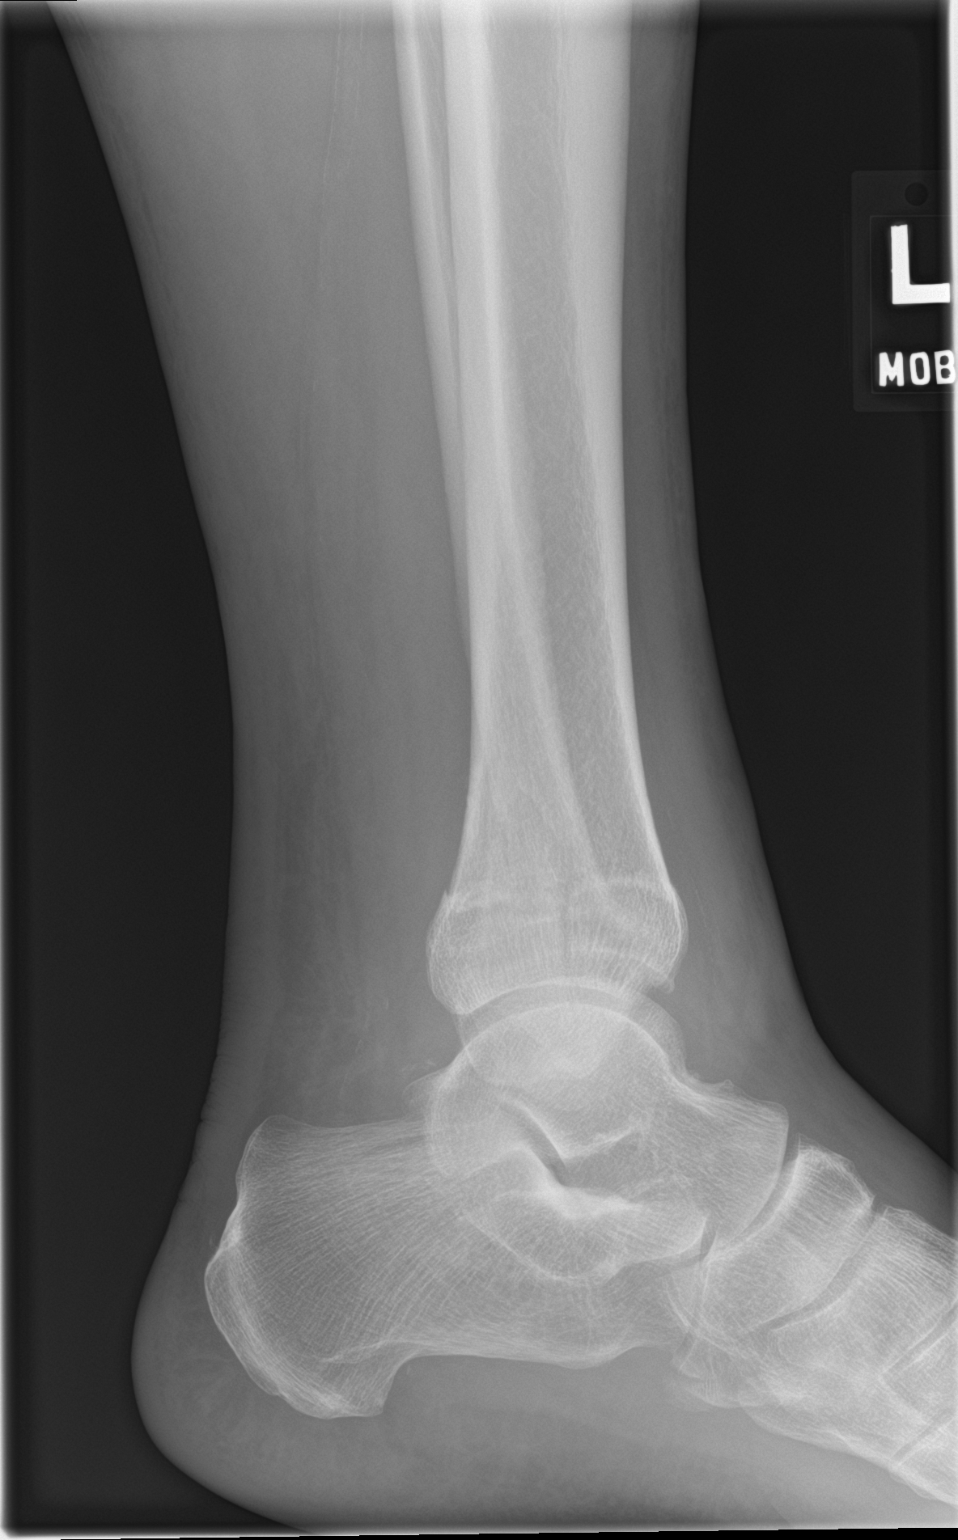

[3 of 3 positions shown; findings below may reference images not displayed]

FINDINGS: There is an oblique fracture of the distal fibula with periosteal
reaction compatible with attempted healing. Fracture remains mildly
displaced laterally by 2-3 mm. Fracture is at the level of the ankle
joint and above.

Newly seen transverse fracture through the distal tibial metaphysis,
nondisplaced. On lateral imaging, there appears to be continuation
in the coronal plane towards the ankle joint. Normal ankle and
hindfoot alignment.

Ankle joint effusion.
IMPRESSION: 1. Newly seen nondisplaced fractures of the distal tibia with
transverse and vertical components.
2. Stable mildly displaced distal fibular fracture since 11/23/2014.
Partial healing has occurred.

## 2016-12-31 ENCOUNTER — Ambulatory Visit (HOSPITAL_COMMUNITY)
Admission: RE | Admit: 2016-12-31 | Discharge: 2016-12-31 | Disposition: A | Discharge status: Home / Self Care | Payer: BLUE CROSS/BLUE SHIELD | Source: Ambulatory Visit | Attending: Family Medicine | Admitting: Family Medicine

## 2016-12-31 ENCOUNTER — Other Ambulatory Visit (HOSPITAL_COMMUNITY): Payer: Self-pay | Admitting: Family Medicine

## 2016-12-31 DIAGNOSIS — M545 Low back pain: Secondary | ICD-10-CM | POA: Diagnosis not present

## 2016-12-31 DIAGNOSIS — G629 Polyneuropathy, unspecified: Secondary | ICD-10-CM

## 2016-12-31 DIAGNOSIS — M549 Dorsalgia, unspecified: Secondary | ICD-10-CM

## 2016-12-31 DIAGNOSIS — M47812 Spondylosis without myelopathy or radiculopathy, cervical region: Secondary | ICD-10-CM | POA: Diagnosis not present

## 2016-12-31 DIAGNOSIS — M5136 Other intervertebral disc degeneration, lumbar region: Secondary | ICD-10-CM | POA: Insufficient documentation

## 2017-07-17 ENCOUNTER — Encounter (HOSPITAL_COMMUNITY): Payer: Self-pay | Admitting: *Deleted

## 2017-07-17 ENCOUNTER — Emergency Department (HOSPITAL_COMMUNITY): Payer: BLUE CROSS/BLUE SHIELD

## 2017-07-17 ENCOUNTER — Other Ambulatory Visit: Payer: Self-pay

## 2017-07-17 ENCOUNTER — Emergency Department (HOSPITAL_COMMUNITY)
Admission: EM | Admit: 2017-07-17 | Discharge: 2017-07-17 | Discharge status: Against Medical Advice | Payer: BLUE CROSS/BLUE SHIELD | Attending: Emergency Medicine | Admitting: Emergency Medicine

## 2017-07-17 DIAGNOSIS — Y93E9 Activity, other interior property and clothing maintenance: Secondary | ICD-10-CM | POA: Diagnosis not present

## 2017-07-17 DIAGNOSIS — Z79899 Other long term (current) drug therapy: Secondary | ICD-10-CM | POA: Diagnosis not present

## 2017-07-17 DIAGNOSIS — Y9289 Other specified places as the place of occurrence of the external cause: Secondary | ICD-10-CM | POA: Insufficient documentation

## 2017-07-17 DIAGNOSIS — W25XXXA Contact with sharp glass, initial encounter: Secondary | ICD-10-CM | POA: Diagnosis not present

## 2017-07-17 DIAGNOSIS — Y999 Unspecified external cause status: Secondary | ICD-10-CM | POA: Insufficient documentation

## 2017-07-17 DIAGNOSIS — Z5320 Procedure and treatment not carried out because of patient's decision for unspecified reasons: Secondary | ICD-10-CM | POA: Insufficient documentation

## 2017-07-17 DIAGNOSIS — S99922A Unspecified injury of left foot, initial encounter: Secondary | ICD-10-CM | POA: Diagnosis present

## 2017-07-17 DIAGNOSIS — S91312A Laceration without foreign body, left foot, initial encounter: Secondary | ICD-10-CM | POA: Insufficient documentation

## 2017-07-17 MED ORDER — POVIDONE-IODINE 10 % EX SOLN
CUTANEOUS | Status: AC
  Filled 2017-07-17: qty 15

## 2017-07-17 NOTE — ED Notes (Signed)
[name], sitter stated she saw patient walk out of room.

## 2017-07-17 NOTE — ED Notes (Signed)
Patient noted to not be in room.

## 2017-07-17 NOTE — ED Notes (Signed)
Patient has foot soaking in betadine and normal saline solution.

## 2017-07-17 NOTE — ED Triage Notes (Signed)
[facility] for laceration to left heel from 3 days ago.  Patient used "horse medicine" to heel.  EMS notified by sheriffs dpt who noted patient walking the street towards [facility] looking for help.

## 2017-07-17 NOTE — ED Triage Notes (Signed)
Patient stated was working, cleaning a basement, and stepped on glass.  Patient reported was wearing boots when glass cut through boots.

## 2017-07-17 NOTE — ED Notes (Signed)
Patient walked out of unit without discharge.

## 2017-07-18 NOTE — ED Provider Notes (Signed)
[facility] EMERGENCY DEPARTMENT Provider Note   CSN: [id number] Arrival date & time: 07/17/17  2126     History   Chief Complaint Chief Complaint  Patient presents with  . Laceration    HPI [name] is a 62 y.o. male.  HPI  The patient is a 62 year old male, he has a known history of bipolar disorder, alcohol abuse and some paranoia, he presents to the hospital stating that he suffered a laceration to his left heel that occurred earlier several days ago when he was helping clean up a basement where there was broken glass some of which cut through his shoe and incised his left heel on the medial surface.  He has had pain since that time, he applied a old family remedy which she describes was a "horse medicine, it was purple, he wiped it onto the area and states that it has not gotten any worse so it must be working."  Denies fevers, swelling, drainage.  He presents requesting evaluation of this wound.  Past Medical History:  Diagnosis Date  . Alcohol abuse   . Bipolar disorder (HCC)   . Hallucinations   . Homeless   . Hx of medication noncompliance   . OCD (obsessive compulsive disorder)   . Paranoid ideation [facility])     Patient Active Problem List   Diagnosis Date Noted  . Dysarthria 11/23/2014  . Alcohol dependence (HCC) 03/03/2013  . Alcohol withdrawal (HCC) 03/03/2013  . Depressive disorder, not elsewhere classified 03/03/2013    Past Surgical History:  Procedure Laterality Date  . FRACTURE SURGERY    . NECK SURGERY         Home Medications    Prior to Admission medications   Medication Sig Start Date End Date Taking? Authorizing Provider  ALPRAZolam Prudy Feeler) 0.5 MG tablet Take 0.5 mg by mouth 3 (three) times daily as needed for anxiety.    [provider]  clindamycin (CLEOCIN) 150 MG capsule Take 1 capsule (150 mg total) by mouth every 6 (six) hours. 12/25/14   Dione Booze, MD  hydrOXYzine (ATARAX/VISTARIL) 25 MG tablet Take 1 tablet (25 mg  total) by mouth every 6 (six) hours. Patient taking differently: Take 25 mg by mouth once as needed for anxiety.  03/22/13   Eber Hong, MD  ibuprofen (ADVIL,MOTRIN) 200 MG tablet Take 600 mg by mouth every 6 (six) hours as needed.    [provider]  loratadine (ALLERGY RELIEF) 10 MG tablet Take 10-20 mg by mouth at bedtime as needed for allergies.    [provider]  oxyCODONE-acetaminophen (PERCOCET) 5-325 MG per tablet Take 1 tablet by mouth every 4 (four) hours as needed for moderate pain. 12/25/14   Dione Booze, MD  oxyCODONE-acetaminophen (PERCOCET/ROXICET) 5-325 MG per tablet Take 1 tablet by mouth every 4 (four) hours as needed. 12/25/14   Dione Booze, MD  sertraline (ZOLOFT) 25 MG tablet Take 25 mg by mouth daily.    [provider]    Family History Family History  Problem Relation Age of Onset  . Kidney failure Mother   . Cancer Father     Social History Social History   Tobacco Use  . Smoking status: Never Smoker  . Smokeless tobacco: Never Used  Substance Use Topics  . Alcohol use: Yes    Comment: daily  . Drug use: Yes    Types: Marijuana     Allergies   Patient has no known allergies.   Review of Systems Review of  Systems  Constitutional: Negative for fever.  Musculoskeletal: Negative for joint swelling.  Skin: Negative for wound.  Neurological: Negative for weakness and numbness.     Physical Exam Updated Vital Signs BP 128/90 (BP Location: Left Arm)   Pulse 80   Temp 98.1 F (36.7 C) (Oral)   Resp 16   Ht 5\' 8"  (1.727 m)   Wt 65.8 kg (145 lb)   SpO2 100%   BMI 22.05 kg/m   Physical Exam  Constitutional: He appears well-developed and well-nourished. No distress.  HENT:  Head: Normocephalic.  Eyes: Conjunctivae are normal. No scleral icterus.  Cardiovascular: Normal rate and regular rhythm.  Pulmonary/Chest: Effort normal and breath sounds normal.  Musculoskeletal: Normal range of motion. He exhibits  tenderness ( ttp over the laceration site - medial Left heel). He exhibits no edema.  Neurological: He is alert. Coordination normal.  Sensation and motor intact Gait is slightly antalgic  Skin: Skin is warm and dry. He is not diaphoretic.  Laceration located on L heel The Laceration is linear shaped The depth is 2mm The length is 3.5 cm  Purple covering on the skin - no drainage, minimal ttp     ED Treatments / Results  Labs (all labs ordered are listed, but only abnormal results are displayed) Labs Reviewed - No data to display   Radiology Dg Foot Complete Left  Result Date: 07/17/2017 CLINICAL DATA:  62 y/o M; laceration to left heel 3 days ago. Stepped on glass. EXAM: LEFT FOOT - COMPLETE 3+ VIEW COMPARISON:  01/05/2015 left foot radiographs. FINDINGS: Hallux valgus and metatarsus adductus varus. Bony prominence of first metatarsal head/bunion. Lisfranc alignment is maintained. Chronic fracture deformities of lower tibial and fibula. Soft tissue irregularity of the heel compatible with history of laceration. No radiopaque foreign body identified. IMPRESSION: Soft tissue irregularity of the heel compatible with history of laceration. No radiopaque foreign body identified. Electronically Signed   By: Mitzi HansenLance  Furusawa-Stratton M.D.   On: 07/17/2017 22:29    Procedures Procedures (including critical care time)  Medications Ordered in ED Medications - No data to display   Initial Impression / Assessment and Plan / ED Course  I have reviewed the triage vital signs and the nursing notes.  Pertinent labs & imaging results that were available during my care of the patient were reviewed by me and considered in my medical decision making (see chart for details).     X-rays reviewed, radiology report reviewed, this correlates with the patient's exam as there is no foreign body on inspection.  After the x-ray was performed the patient was witnessed to be leaving the room and left without  speaking to staff.  I did not have the opportunity to speak with the patient as this was not communicated to me until he was out of the building.  The patient did not have any signs of infection, no signs of foreign body on x-ray, his wound was soaked in sterile saline with some Betadine prior to the patient leaving.  Attempted to contact patient without success by phone - left message with friend's phone voicemail - no answer - this is the contact number he gave.  Final Clinical Impressions(s) / ED Diagnoses   Final diagnoses:  Laceration of left foot, initial encounter    ED Discharge Orders    None       Eber HongMiller, Gavinn Collard, MD 07/18/17 1526

## 2017-07-20 ENCOUNTER — Emergency Department (HOSPITAL_COMMUNITY)
Admission: EM | Admit: 2017-07-20 | Discharge: 2017-07-20 | Disposition: A | Discharge status: Home / Self Care | Payer: BLUE CROSS/BLUE SHIELD | Attending: Emergency Medicine | Admitting: Emergency Medicine

## 2017-07-20 ENCOUNTER — Other Ambulatory Visit: Payer: Self-pay

## 2017-07-20 ENCOUNTER — Encounter (HOSPITAL_COMMUNITY): Payer: Self-pay | Admitting: Emergency Medicine

## 2017-07-20 DIAGNOSIS — S91312D Laceration without foreign body, left foot, subsequent encounter: Secondary | ICD-10-CM | POA: Diagnosis present

## 2017-07-20 DIAGNOSIS — W268XXD Contact with other sharp object(s), not elsewhere classified, subsequent encounter: Secondary | ICD-10-CM | POA: Insufficient documentation

## 2017-07-20 DIAGNOSIS — Z79899 Other long term (current) drug therapy: Secondary | ICD-10-CM | POA: Diagnosis not present

## 2017-07-20 DIAGNOSIS — F319 Bipolar disorder, unspecified: Secondary | ICD-10-CM | POA: Diagnosis not present

## 2017-07-20 MED ORDER — POVIDONE-IODINE 10 % EX SOLN
CUTANEOUS | Status: AC
  Administered 2017-07-20: 1
  Filled 2017-07-20: qty 15

## 2017-07-20 MED ORDER — DOXYCYCLINE HYCLATE 100 MG PO CAPS: 100.0000 mg | ORAL_CAPSULE | Freq: Two times a day (BID) | ORAL | 0 refills | Status: DC

## 2017-07-20 MED ORDER — DOXYCYCLINE HYCLATE 100 MG PO TABS
100.0000 mg | ORAL_TABLET | Freq: Once | ORAL | Status: AC
Start: 2017-07-20 — End: 2017-07-20
  Administered 2017-07-20: 100 mg via ORAL
  Filled 2017-07-20: qty 1

## 2017-07-20 MED ORDER — HYDROCODONE-ACETAMINOPHEN 5-325 MG PO TABS
1.0000 | ORAL_TABLET | Freq: Once | ORAL | Status: AC
  Administered 2017-07-20: 1 via ORAL
  Filled 2017-07-20: qty 1

## 2017-07-20 NOTE — ED Provider Notes (Signed)
[facility] Provider Note   CSN: [id number] Arrival date & time: 07/20/17  0350     History   Chief Complaint Chief Complaint  Patient presents with  . Foot Injury    infection    HPI Beatris SiJames D Dambach is a 62 y.o. male.  The history is provided by the patient.  Foot Injury   The incident occurred more than 2 days ago. The pain is present in the left heel. The quality of the pain is described as aching. The pain is moderate. The pain has been constant since onset. It is unknown if a foreign body is present. The symptoms are aggravated by bearing weight and activity. Treatments tried: home remedy.  Patient with history of alcohol abuse and bipolar disorder presents for repeat visit to evaluate a foot injury. He reports several days ago he sustained a laceration to the heel of his left foot.  He reports he was wearing boots, and was working in in a basement and was walking through water, and he suspects he somehow lacerated his foot even though he was wearing boots.  He was seen the night of March 8, for this wound, had a negative x-ray of the left prior to full evaluation could be completed.  Since that time the patient is continued to use a home remedy, which is typically used for horse wounds. He reports continued pain in the left foot.  No drainage from the foot.  No fevers or chills.  He has no other new complaints  Past Medical History:  Diagnosis Date  . Alcohol abuse   . Bipolar disorder (HCC)   . Hallucinations   . Homeless   . Hx of medication noncompliance   . OCD (obsessive compulsive disorder)   . Paranoid ideation [facility](HCC)     Patient Active Problem List   Diagnosis Date Noted  . Dysarthria 11/23/2014  . Alcohol dependence (HCC) 03/03/2013  . Alcohol withdrawal (HCC) 03/03/2013  . Depressive disorder, not elsewhere classified 03/03/2013    Past Surgical History:  Procedure Laterality Date  . FRACTURE SURGERY    . NECK SURGERY          Home Medications    Prior to Admission medications   Medication Sig Start Date End Date Taking? Authorizing Provider  ALPRAZolam Prudy Feeler(XANAX) 0.5 MG tablet Take 0.5 mg by mouth 3 (three) times daily as needed for anxiety.    [provider]  clindamycin (CLEOCIN) 150 MG capsule Take 1 capsule (150 mg total) by mouth every 6 (six) hours. 12/25/14   Dione BoozeGlick, David, MD  hydrOXYzine (ATARAX/VISTARIL) 25 MG tablet Take 1 tablet (25 mg total) by mouth every 6 (six) hours. Patient taking differently: Take 25 mg by mouth once as needed for anxiety.  03/22/13   Eber HongMiller, Brian, MD  ibuprofen (ADVIL,MOTRIN) 200 MG tablet Take 600 mg by mouth every 6 (six) hours as needed.    [provider]  loratadine (ALLERGY RELIEF) 10 MG tablet Take 10-20 mg by mouth at bedtime as needed for allergies.    [provider]  oxyCODONE-acetaminophen (PERCOCET) 5-325 MG per tablet Take 1 tablet by mouth every 4 (four) hours as needed for moderate pain. 12/25/14   Dione BoozeGlick, David, MD  oxyCODONE-acetaminophen (PERCOCET/ROXICET) 5-325 MG per tablet Take 1 tablet by mouth every 4 (four) hours as needed. 12/25/14   Dione BoozeGlick, David, MD  sertraline (ZOLOFT) 25 MG tablet Take 25 mg by mouth daily.    [provider]  Family History Family History  Problem Relation Age of Onset  . Kidney failure Mother   . Cancer Father     Social History Social History   Tobacco Use  . Smoking status: Never Smoker  . Smokeless tobacco: Never Used  Substance Use Topics  . Alcohol use: Yes    Comment: daily  . Drug use: Yes    Types: Marijuana     Allergies   Patient has no known allergies.   Review of Systems Review of Systems  Constitutional: Negative for fever.  Cardiovascular: Negative for chest pain.  Gastrointestinal: Negative for vomiting.  Musculoskeletal: Positive for arthralgias.  Skin: Positive for wound.  All other systems reviewed and are negative.    Physical Exam Updated  Vital Signs BP (!) 143/80 (BP Location: Left Arm)   Pulse 80   Temp 98.4 F (36.9 C) (Oral)   Resp 17   Ht 1.727 m (5\' 8" )   Wt 66.2 kg (146 lb)   SpO2 100%   BMI 22.20 kg/m   Physical Exam CONSTITUTIONAL: Disheveled, anxious HEAD: Normocephalic/atraumatic EYES: EOMI ENMT: Mucous membranes moist NECK: supple no meningeal signs CV: S1/S2 noted LUNGS: Lungs are clear to auscultation bilaterally, no apparent distress ABDOMEN: soft NEURO: Pt is awake/alert/appropriate, moves all extremitiesx4.  No facial droop.   EXTREMITIES: pulses normal/equal, full ROM, chronic deformities of left ankle, bunion noted left foot.  Wound is noted to left foot, no bleeding, no drainage.  Full range of motion of left ankle.  There is no tenderness to the dorsal aspect of left foot or left ankle  see photo below SKIN: warm, color normal PSYCH: Anxious    Patient gave verbal permission to utilize photo for medical documentation only The image was not stored on any personal device ED Treatments / Results  Labs (all labs ordered are listed, but only abnormal results are displayed) Labs Reviewed - No data to display  EKG  EKG Interpretation None       Radiology No results found.  Procedures Procedures (including critical care time)  Medications Ordered in ED Medications  HYDROcodone-acetaminophen (NORCO/VICODIN) 5-325 MG per tablet 1 tablet (1 tablet Oral Given 07/20/17 0459)  doxycycline (VIBRA-TABS) tablet 100 mg (100 mg Oral Given 07/20/17 0459)  povidone-iodine (BETADINE) 10 % external solution (1 application  Given 07/20/17 0502)     Initial Impression / Assessment and Plan / ED Course  I have reviewed the triage vital signs and the nursing notes.  Pertinent imaging results that were available during my care of the patient were reviewed by me and considered in my medical decision making (see chart for details).     Patient presents for repeat evaluation. On previous ER visit he  left prior to final evaluation. The wound was covered in a purple substance that he claims is used for horse wounds Overall, there is no other obvious secondary signs of infection.  I reviewed the recent x-ray of his foot and no foreign bodies noted.  There is no obvious drainage from the foot.  I have instructed nursing to cleanse the foot once again but with water and Betadine. 5:27 AM I rechecked wound, no signs of any discharge or foul smell.  Advised to keep the wound clean and dry.  Advised him not to use any more home remedies.  Will start doxycycline for 1 week.  He reports he is already supposed to see a podiatrist for his bunion Final Clinical Impressions(s) / ED Diagnoses   Final diagnoses:  Laceration of left foot, subsequent encounter    ED Discharge Orders        Ordered    doxycycline (VIBRAMYCIN) 100 MG capsule  2 times daily     07/20/17 0526       Zadie Rhine, MD 07/20/17 [id number]

## 2017-07-20 NOTE — ED Triage Notes (Signed)
Pt states he was cleaning out a basement 4-5 days ago with standing water and cut his foot on glass, pt states he was wearing boots at the time of injury, pt states he was seen here yesterday but was tired of waiting so he left, pt states he rcvd a message today that he needed to return to the ED for antibiotics

## 2017-08-07 ENCOUNTER — Encounter (HOSPITAL_COMMUNITY): Payer: Self-pay | Admitting: Emergency Medicine

## 2017-08-07 ENCOUNTER — Emergency Department (HOSPITAL_COMMUNITY)
Admission: EM | Admit: 2017-08-07 | Discharge: 2017-08-07 | Disposition: A | Discharge status: Home / Self Care | Payer: BLUE CROSS/BLUE SHIELD | Attending: Emergency Medicine | Admitting: Emergency Medicine

## 2017-08-07 DIAGNOSIS — W57XXXA Bitten or stung by nonvenomous insect and other nonvenomous arthropods, initial encounter: Secondary | ICD-10-CM | POA: Insufficient documentation

## 2017-08-07 DIAGNOSIS — Y929 Unspecified place or not applicable: Secondary | ICD-10-CM | POA: Insufficient documentation

## 2017-08-07 DIAGNOSIS — Z5321 Procedure and treatment not carried out due to patient leaving prior to being seen by health care provider: Secondary | ICD-10-CM | POA: Insufficient documentation

## 2017-08-07 DIAGNOSIS — Y939 Activity, unspecified: Secondary | ICD-10-CM | POA: Diagnosis not present

## 2017-08-07 DIAGNOSIS — S40261A Insect bite (nonvenomous) of right shoulder, initial encounter: Secondary | ICD-10-CM | POA: Insufficient documentation

## 2017-08-07 DIAGNOSIS — Y998 Other external cause status: Secondary | ICD-10-CM | POA: Insufficient documentation

## 2017-08-07 NOTE — ED Triage Notes (Signed)
Pt state he got 5 ticks off of him the other day and has one still stuck on his right shoulder.  Had RMSF before and is concerned.  States he is nauseated.

## 2017-08-07 NOTE — ED Notes (Addendum)
Pt found walking out.  Security at desk.  Pt says, "I has to go to work, this is his first day at work and I can't be late."  Pt signed out AMA.

## 2017-08-19 ENCOUNTER — Encounter (HOSPITAL_COMMUNITY): Payer: Self-pay

## 2017-08-19 ENCOUNTER — Emergency Department (HOSPITAL_COMMUNITY)
Admission: EM | Admit: 2017-08-19 | Discharge: 2017-08-19 | Disposition: A | Discharge status: Home / Self Care | Payer: BLUE CROSS/BLUE SHIELD | Attending: Emergency Medicine | Admitting: Emergency Medicine

## 2017-08-19 ENCOUNTER — Other Ambulatory Visit: Payer: Self-pay

## 2017-08-19 DIAGNOSIS — Z79899 Other long term (current) drug therapy: Secondary | ICD-10-CM | POA: Diagnosis not present

## 2017-08-19 DIAGNOSIS — R5383 Other fatigue: Secondary | ICD-10-CM | POA: Insufficient documentation

## 2017-08-19 DIAGNOSIS — R509 Fever, unspecified: Secondary | ICD-10-CM | POA: Insufficient documentation

## 2017-08-19 DIAGNOSIS — W57XXXA Bitten or stung by nonvenomous insect and other nonvenomous arthropods, initial encounter: Secondary | ICD-10-CM | POA: Insufficient documentation

## 2017-08-19 DIAGNOSIS — S20469A Insect bite (nonvenomous) of unspecified back wall of thorax, initial encounter: Secondary | ICD-10-CM | POA: Diagnosis not present

## 2017-08-19 DIAGNOSIS — Y9301 Activity, walking, marching and hiking: Secondary | ICD-10-CM | POA: Insufficient documentation

## 2017-08-19 DIAGNOSIS — Y9279 Other farm location as the place of occurrence of the external cause: Secondary | ICD-10-CM | POA: Diagnosis not present

## 2017-08-19 DIAGNOSIS — Y999 Unspecified external cause status: Secondary | ICD-10-CM | POA: Diagnosis not present

## 2017-08-19 LAB — BASIC METABOLIC PANEL
Anion gap: 12 (ref 5–15)
BUN: 13 mg/dL (ref 6–20)
CHLORIDE: 94 mmol/L — AB (ref 101–111)
CO2: 26 mmol/L (ref 22–32)
CREATININE: 0.77 mg/dL (ref 0.61–1.24)
Calcium: 9 mg/dL (ref 8.9–10.3)
GFR calc non Af Amer: >60 mL/min (ref >60)
GLUCOSE: 92 mg/dL (ref 65–99)
Potassium: 3.8 mmol/L (ref 3.5–5.1)
Sodium: 132 mmol/L — ABNORMAL LOW (ref 135–145)

## 2017-08-19 LAB — CBC WITH DIFFERENTIAL/PLATELET
Basophils Absolute: 0.1 10*3/uL (ref 0.0–0.1)
Basophils Relative: 2 %
Eosinophils Absolute: 0.1 10*3/uL (ref 0.0–0.7)
Eosinophils Relative: 1 %
HCT: 42 % (ref 39.0–52.0)
Hemoglobin: 14.1 g/dL (ref 13.0–17.0)
Lymphocytes Relative: 16 %
Lymphs Abs: 0.8 10*3/uL (ref 0.7–4.0)
MCH: 32.4 pg (ref 26.0–34.0)
MCHC: 33.6 g/dL (ref 30.0–36.0)
MCV: 96.6 fL (ref 78.0–100.0)
Monocytes Absolute: 0.6 10*3/uL (ref 0.1–1.0)
Monocytes Relative: 11 %
Neutro Abs: 3.7 10*3/uL (ref 1.7–7.7)
Neutrophils Relative %: 70 %
Platelets: 268 10*3/uL (ref 150–400)
RBC: 4.35 MIL/uL (ref 4.22–5.81)
RDW: 13.2 % (ref 11.5–15.5)
WBC: 5.3 10*3/uL (ref 4.0–10.5)

## 2017-08-19 MED ORDER — ONDANSETRON HCL 4 MG PO TABS
4.0000 mg | ORAL_TABLET | Freq: Once | ORAL | Status: AC
  Administered 2017-08-19: 4 mg via ORAL
  Filled 2017-08-19: qty 1

## 2017-08-19 MED ORDER — DOXYCYCLINE HYCLATE 100 MG PO CAPS: 100.0000 mg | ORAL_CAPSULE | Freq: Two times a day (BID) | ORAL | 0 refills | Status: DC

## 2017-08-19 MED ORDER — DOXYCYCLINE HYCLATE 100 MG PO TABS
100.0000 mg | ORAL_TABLET | Freq: Once | ORAL | Status: AC
  Administered 2017-08-19: 100 mg via ORAL
  Filled 2017-08-19: qty 1

## 2017-08-19 NOTE — ED Provider Notes (Signed)
[facility] Provider Note   CSN: [id number] Arrival date & time: 08/19/17  0732     History   Chief Complaint Chief Complaint  Patient presents with  . Fatigue  . tick bite    HPI [name] is a 62 y.o. male.  Patient is a 62 year old male who presents to the emergency department with a complaint of fatigue following tick bites.  The patient states that 2-3 days ago he received multiple tick bites while walk [place].  He states that since that time he has been feeling feverish, having some chills, and feeling fatigued.  The patient states that he had similar symptoms back in 2016 and at that time he was diagnosed [facility] fever.  The patient has not measured a temperature at home.  He states that he has had some chills.  He is not been vomiting, has not had any diarrhea, and there is been no unusual rash.  Patient has no difficulty with swallowing, no unusual cough.  He presents now for assistance with this issue.  The history is provided by the patient.    Past Medical History:  Diagnosis Date  . Alcohol abuse   . Bipolar disorder (HCC)   . Hallucinations   . Homeless    pt says he now lives in his father's farm house  . Hx of medication noncompliance   . OCD (obsessive compulsive disorder)   . Paranoid ideation [facility](HCC)     Patient Active Problem List   Diagnosis Date Noted  . Dysarthria 11/23/2014  . Alcohol dependence (HCC) 03/03/2013  . Alcohol withdrawal (HCC) 03/03/2013  . Depressive disorder, not elsewhere classified 03/03/2013    Past Surgical History:  Procedure Laterality Date  . FRACTURE SURGERY    . NECK SURGERY          Home Medications    Prior to Admission medications   Medication Sig Start Date End Date Taking? Authorizing Provider  ALPRAZolam Prudy Feeler(XANAX) 0.5 MG tablet Take 0.5 mg by mouth 3 (three) times daily as needed for anxiety.    [provider]  clindamycin  (CLEOCIN) 150 MG capsule Take 1 capsule (150 mg total) by mouth every 6 (six) hours. 12/25/14   Dione BoozeGlick, David, MD  doxycycline (VIBRAMYCIN) 100 MG capsule Take 1 capsule (100 mg total) by mouth 2 (two) times daily. One po bid x 7 days 07/20/17   Zadie RhineWickline, Donald, MD  hydrOXYzine (ATARAX/VISTARIL) 25 MG tablet Take 1 tablet (25 mg total) by mouth every 6 (six) hours. Patient taking differently: Take 25 mg by mouth once as needed for anxiety.  03/22/13   Eber HongMiller, Brian, MD  ibuprofen (ADVIL,MOTRIN) 200 MG tablet Take 600 mg by mouth every 6 (six) hours as needed.    [provider]  loratadine (ALLERGY RELIEF) 10 MG tablet Take 10-20 mg by mouth at bedtime as needed for allergies.    [provider]  sertraline (ZOLOFT) 25 MG tablet Take 25 mg by mouth daily.    [provider]    Family History Family History  Problem Relation Age of Onset  . Kidney failure Mother   . Cancer Father     Social History Social History   Tobacco Use  . Smoking status: Never Smoker  . Smokeless tobacco: Never Used  Substance Use Topics  . Alcohol use: Yes    Comment: daily- pt says drank 3 or 4 days ago  .  Drug use: Not Currently    Types: Marijuana     Allergies   Patient has no known allergies.   Review of Systems Review of Systems  Constitutional: Positive for chills and fatigue. Negative for activity change.       All ROS Neg except as noted in HPI  HENT: Negative for nosebleeds.   Eyes: Negative for photophobia and discharge.  Respiratory: Negative for cough, shortness of breath and wheezing.   Cardiovascular: Negative for chest pain and palpitations.  Gastrointestinal: Negative for abdominal pain and blood in stool.  Genitourinary: Negative for dysuria, frequency and hematuria.  Musculoskeletal: Negative for arthralgias, back pain and neck pain.  Skin: Negative.        Multiple tick bites  Neurological: Negative for dizziness, seizures and speech difficulty.    Psychiatric/Behavioral: Negative for confusion and hallucinations.     Physical Exam Updated Vital Signs BP (!) 152/98 (BP Location: Right Arm)   Pulse 90   Temp 98 F (36.7 C) (Oral)   Resp 20   Ht 5\' 8"  (1.727 m)   Wt 63.5 kg (140 lb)   SpO2 98%   BMI 21.29 kg/m   Physical Exam  Constitutional: He is oriented to person, place, and time. He appears well-developed and well-nourished.  Non-toxic appearance.  HENT:  Head: Normocephalic.  Right Ear: Tympanic membrane and external ear normal.  Left Ear: Tympanic membrane and external ear normal.  No oral lesions noted.  Airway is patent.  Eyes: Pupils are equal, round, and reactive to light. EOM and lids are normal.  Neck: Normal range of motion. Neck supple. Carotid bruit is not present.  Cardiovascular: Normal rate, regular rhythm, normal heart sounds, intact distal pulses and normal pulses. Exam reveals no gallop and no friction rub.  No murmur heard. Pulmonary/Chest: Breath sounds normal. No respiratory distress.  Abdominal: Soft. Bowel sounds are normal. There is no tenderness. There is no guarding.  Musculoskeletal: Normal range of motion.  Lymphadenopathy:       Head (right side): No submandibular adenopathy present.       Head (left side): No submandibular adenopathy present.    He has no cervical adenopathy.  Neurological: He is alert and oriented to person, place, and time. He has normal strength. No cranial nerve deficit or sensory deficit.  Skin: Skin is warm and dry.  Multiple tick bites involving the lower back as well as the right shoulder. No red splotches or other significant rash appreciated at this time.  Psychiatric: He has a normal mood and affect. His speech is normal.  Nursing note and vitals reviewed.    ED Treatments / Results  Labs (all labs ordered are listed, but only abnormal results are displayed) Labs Reviewed  CBC WITH DIFFERENTIAL/PLATELET  BASIC METABOLIC PANEL  ROCKY MTN SPOTTED FVR  ABS PNL(IGG+IGM)    EKG None  Radiology No results found.  Procedures Procedures (including critical care time)  Medications Ordered in ED Medications - No data to display   Initial Impression / Assessment and Plan / ED Course  I have reviewed the triage vital signs and the nursing notes.  Pertinent labs & imaging results that were available during my care of the patient were reviewed by me and considered in my medical decision making (see chart for details).       Final Clinical Impressions(s) / ED Diagnoses MDM Pt in no distress at this time. Vital signs reviewed.  No significant temperature elevation or pulse rate elevation.  Patient  reports of fever chills and headache as well as fatigue.  A [place] spotted fever titer has been obtained.  Complete blood count and conference of metabolic panel both negative for acute changes.  Patient started on doxycycline.  Patient is to follow-up with Dr. Selena Batten, or return to the emergency department if any high fever, unusual rash, changes in his condition, problems, or concerns.   Final diagnoses:  Tick bite, initial encounter    ED Discharge Orders        Ordered    doxycycline (VIBRAMYCIN) 100 MG capsule  2 times daily     08/19/17 1030       Ivery Quale, PA-C 08/19/17 1037    Samuel Jester, DO 08/22/17 [phone]

## 2017-08-19 NOTE — Discharge Instructions (Addendum)
Your vital signs have been reviewed.  Please use caution in removing any additional tick bites.  Use doxycycline 2 times daily with food until all taken.  Please see Dr. Selena BattenKim return to the emergency department if any exceptional high temperatures, unusual headaches, unusual rash, changes in your condition, problems or concerns.  A [place] spotted fever titer test has been sent to the lab.

## 2017-08-19 NOTE — ED Triage Notes (Signed)
Pt reports removed several ticks from back 3 days ago and has had fever, chills, headache, and fatigue since then.

## 2017-08-21 LAB — RMSF, IGG, IFA: RMSF, IGG, IFA: 1:128 {titer} — ABNORMAL HIGH

## 2017-08-21 LAB — ROCKY MTN SPOTTED FVR ABS PNL(IGG+IGM)
RMSF IGG: POSITIVE — AB
RMSF IGM: 1.99 {index} — AB (ref 0.00–0.89)

## 2018-03-19 ENCOUNTER — Encounter: Payer: Self-pay | Admitting: Internal Medicine

## 2018-06-09 ENCOUNTER — Telehealth: Payer: Self-pay | Admitting: *Deleted

## 2018-06-09 ENCOUNTER — Ambulatory Visit (INDEPENDENT_AMBULATORY_CARE_PROVIDER_SITE_OTHER): Payer: PRIVATE HEALTH INSURANCE | Admitting: Nurse Practitioner

## 2018-06-09 ENCOUNTER — Encounter: Payer: Self-pay | Admitting: Nurse Practitioner

## 2018-06-09 ENCOUNTER — Other Ambulatory Visit: Payer: Self-pay | Admitting: *Deleted

## 2018-06-09 ENCOUNTER — Encounter: Payer: Self-pay | Admitting: *Deleted

## 2018-06-09 DIAGNOSIS — Z0000 Encounter for general adult medical examination without abnormal findings: Secondary | ICD-10-CM | POA: Insufficient documentation

## 2018-06-09 DIAGNOSIS — Z800 Family history of malignant neoplasm of digestive organs: Secondary | ICD-10-CM | POA: Diagnosis not present

## 2018-06-09 MED ORDER — PEG 3350-KCL-NA BICARB-NACL 420 G PO SOLR: 4000.0000 mL | Freq: Once | ORAL | 0 refills | Status: AC

## 2018-06-09 NOTE — Telephone Encounter (Signed)
Called patient insurance ambetter of [place] at [phone]. Spoke with Monat. No PA is required. Ref# [id number].

## 2018-06-09 NOTE — Patient Instructions (Signed)
Your health issues we discussed today were:   Need for colonoscopy: 1. You do have an increased risk of colon cancer due to your father having colon cancer 2. We will schedule your colonoscopy for you. 3. Further recommendations will be made after your colonoscopy  Overall I recommend:  1. Follow-up based on recommendations made after your colonoscopy 2. Call us if you have any questions or concerns  At [facility] we value your feedback. You may receive a survey about your visit today. Please share your experience as we strive to create trusting relationships with our patients to provide genuine, compassionate, quality care.  We appreciate your understanding and patience as we review any laboratory studies, imaging, and other diagnostic tests that are ordered as we care for you. Our office policy is 5 business days for review of these results, and any emergent or urgent results are addressed in a timely manner for your best interest. If you do not hear from our office in 1 week, please contact us.   We also encourage the use of MyChart, which contains your medical information for your review as well. If you are not enrolled in this feature, an access code is on this after visit summary for your convenience. Thank you for allowing [place] to be involved in your care.  It was great to see you today!  I hope you have a great day!!

## 2018-06-09 NOTE — Telephone Encounter (Signed)
Pre-op scheduled for 07/22/2018 at 11:00am. Letter mailed. Spoke with patient friend Mariana Kaufman (given verbal okay by patient while in office scheduling to give [employer]). He is aware of pre-op appt information. Letter also mailed.

## 2018-06-09 NOTE — Assessment & Plan Note (Addendum)
Noted family history of colon cancer in a primary relative: His father who was diagnosed at age 63 and passed away at age 61 from metastatic colon cancer.  The patient is 63 years old and is never had a colonoscopy.  Does have some chronic low back pain, not on any current chronic pain medications.  Smokes marijuana intermittently for pain.  Overall asymptomatic from a GI standpoint other than intermittent GERD which is currently being managed by his primary care.  We will proceed with first-ever screening colonoscopy at this time.  As an aside his blood pressure is somewhat elevated today, but he is asymptomatic.  Recommend he follow-up with primary care to address.  Proceed with TCS on propofol/MAC with Dr. Gala Romney in near future: the risks, benefits, and alternatives have been discussed with the patient in detail. The patient states understanding and desires to proceed.  The patient is currently on Celexa.  No other anticoagulants, anxiolytics, chronic pain medications, or antidepressants.  Drinks about 2 alcoholic drinks a day.  Smokes marijuana intermittently for pain relief.  No other recreational drugs.  We will plan for the procedure on propofol/MAC to promote adequate sedation.

## 2018-06-09 NOTE — Progress Notes (Signed)
Primary Care Physician:  Pearson Grippe, MD Primary Gastroenterologist:  Dr. Jena Gauss  Chief Complaint  Patient presents with  . Colonoscopy  . Gastroesophageal Reflux  . Bloated    HPI:   [name] is a 63 y.o. male who presents on referral from primary care to schedule colonoscopy.  Nurse/phone triage was deferred to office visit due to alcohol intake likely necessitating augmented sedation.  Reviewed information provided with the referral including office visit dated 02/10/2018 which indicates patient with a family history of colon cancer in his father and the patient has never had a colonoscopy.  He was subsequently referred to [place].  No history of colonoscopy in our system.  Today he states he's never had a colonoscopy. His father was diagnosed with colon cancer at age 59 and passed from that at age 63. Has been having a lot of bloating, gas and has heard radio commercials for EPI and wonders if he has this. He does admit GERD and was changed from Omeprazole to pantoprazole, which he feels helps; avoids spicy and acidic foods. Denies hematochezia, melena, fever, chills, unintentional weight loss. Denies chest pain, dyspnea, dizziness, lightheadedness, syncope, near syncope. Denies any other upper or lower GI symptoms.  Past Medical History:  Diagnosis Date  . Alcohol abuse   . Bipolar disorder (HCC)   . Hallucinations   . Homeless    pt says he now lives in his father's farm house  . Hx of medication noncompliance   . OCD (obsessive compulsive disorder)   . Paranoid ideation [facility])     Past Surgical History:  Procedure Laterality Date  . FRACTURE SURGERY    . NECK SURGERY      Current Outpatient Medications  Medication Sig Dispense Refill  . citalopram (CELEXA) 20 MG tablet Take 1 tablet by mouth daily.    Marland Kitchen ibuprofen (ADVIL,MOTRIN) 200 MG tablet Take 600 mg by mouth every 6 (six) hours as needed.    . loratadine (ALLERGY RELIEF) 10 MG tablet Take 10-20 mg by mouth at  bedtime as needed for allergies.    . pantoprazole (PROTONIX) 20 MG tablet Take 1 tablet by mouth daily.     No current facility-administered medications for this visit.     Allergies as of 06/09/2018  . (No Known Allergies)    Family History  Problem Relation Age of Onset  . Kidney failure Mother   . Colon cancer Father 88    Social History   Socioeconomic History  . Marital status: Divorced    Spouse name: Not on file  . Number of children: Not on file  . Years of education: Not on file  . Highest education level: Not on file  Occupational History  . Not on file  Social Needs  . Financial resource strain: Not on file  . Food insecurity:    Worry: Not on file    Inability: Not on file  . Transportation needs:    Medical: Not on file    Non-medical: Not on file  Tobacco Use  . Smoking status: Never Smoker  . Smokeless tobacco: Never Used  Substance and Sexual Activity  . Alcohol use: Yes    Comment: daily- couple drinks   . Drug use: Yes    Types: Marijuana    Comment: "rare"  . Sexual activity: Never  Lifestyle  . Physical activity:    Days per week: Not on file    Minutes per session: Not on file  . Stress:  Not on file  Relationships  . Social connections:    Talks on phone: Not on file    Gets together: Not on file    Attends religious service: Not on file    Active member of club or organization: Not on file    Attends meetings of clubs or organizations: Not on file    Relationship status: Not on file  . Intimate partner violence:    Fear of current or ex partner: Not on file    Emotionally abused: Not on file    Physically abused: Not on file    Forced sexual activity: Not on file  Other Topics Concern  . Not on file  Social History Narrative  . Not on file    Review of Systems: General: Negative for anorexia, weight loss, fever, chills, fatigue, weakness. ENT: Negative for hoarseness, difficulty swallowing. CV: Negative for chest pain,  angina, palpitations, peripheral edema.  Respiratory: Negative for dyspnea at rest, cough, sputum, wheezing.  GI: See history of present illness. MS: Chronic low back pain.  Derm: Negative for rash or itching.  Endo: Negative for unusual weight change.  Heme: Negative for bruising or bleeding. Allergy: Negative for rash or hives.    Physical Exam: BP (!) 167/96   Pulse 78   Temp (!) 97.5 F (36.4 C) (Oral)   Ht 5\' 8"  (1.727 m)   Wt 165 lb 9.6 oz (75.1 kg)   BMI 25.18 kg/m  General:   Alert and oriented. Pleasant and cooperative. Well-nourished and well-developed.  Head:  Normocephalic and atraumatic. Eyes:  Without icterus, sclera clear and conjunctiva pink.  Ears:  Normal auditory acuity. Cardiovascular:  S1, S2 present without murmurs appreciated. Extremities without clubbing or edema. Respiratory:  Clear to auscultation bilaterally. No wheezes, rales, or rhonchi. No distress.  Gastrointestinal:  +BS, soft, non-tender and non-distended. No HSM noted. No guarding or rebound. No masses appreciated.  We will proceed with Rectal:  Deferred  Musculoskalatal:  Symmetrical without gross deformities. Neurologic:  Alert and oriented x4;  grossly normal neurologically. Psych:  Alert and cooperative. Normal mood and affect. Heme/Lymph/Immune: No excessive bruising noted.    06/09/2018 8:49 AM   Disclaimer: This note was dictated with voice recognition software. Similar sounding words can inadvertently be transcribed and may not be corrected upon review.

## 2018-06-09 NOTE — Progress Notes (Signed)
CC'D TO PCP °

## 2018-07-20 NOTE — Patient Instructions (Signed)
[name]  07/20/2018     @PREFPERIOPPHARMACY @   Your procedure is scheduled on  07/29/2018 .  [facility] to [name] at  745   A.M.  Call this number if you have problems the morning of surgery:  [phone]   Remember:  Follow the diet and prep instructions given to you by Dr Luvenia Starch office.                        Take these medicines the morning of surgery with A SIP OF WATER  Celexa, protonix.    Do not wear jewelry, make-up or nail polish.  Do not wear lotions, powders, or perfumes, or deodorant.  Do not shave 48 hours prior to surgery.  Men may shave face and neck.  Do not bring valuables to the hospital.  [facility] is not responsible for any belongings or valuables.  Contacts, dentures or bridgework may not be worn into surgery.  Leave your suitcase in the car.  After surgery it may be brought to your room.  For patients admitted to the hospital, discharge time will be determined by your treatment team.  Patients discharged the day of surgery will not be allowed to drive home.   Name and phone number of your driver:   family Special instructions:  None  Please read over the following fact sheets that you were given. Anesthesia Post-op Instructions and Care and Recovery After Surgery       Colonoscopy, Adult, Care After This sheet gives you information about how to care for yourself after your procedure. Your health care provider may also give you more specific instructions. If you have problems or questions, contact your health care provider. What can I expect after the procedure? After the procedure, it is common to have:  A small amount of blood in your stool for 24 hours after the procedure.  Some gas.  Mild abdominal cramping or bloating. Follow these instructions at home: General instructions  For the first 24 hours after the procedure: ? Do not drive or use machinery. ? Do not sign important documents. ? Do not drink alcohol. ? Do your  regular daily activities at a slower pace than normal. ? Eat soft, easy-to-digest foods.  Take over-the-counter or prescription medicines only as told by your health care provider. Relieving cramping and bloating   Try walking around when you have cramps or feel bloated.  Apply heat to your abdomen as told by your health care provider. Use a heat source that your health care provider recommends, such as a moist heat pack or a heating pad. ? Place a towel between your skin and the heat source. ? Leave the heat on for 20-30 minutes. ? Remove the heat if your skin turns bright red. This is especially important if you are unable to feel pain, heat, or cold. You may have a greater risk of getting burned. Eating and drinking   Drink enough fluid to keep your urine pale yellow.  Resume your normal diet as instructed by your health care provider. Avoid heavy or fried foods that are hard to digest.  Avoid drinking alcohol for as long as instructed by your health care provider. Contact a health care provider if:  You have blood in your stool 2-3 days after the procedure. Get help right away if:  You have more than a small spotting of blood in your stool.  You pass large blood  clots in your stool.  Your abdomen is swollen.  You have nausea or vomiting.  You have a fever.  You have increasing abdominal pain that is not relieved with medicine. Summary  After the procedure, it is common to have a small amount of blood in your stool. You may also have mild abdominal cramping and bloating.  For the first 24 hours after the procedure, do not drive or use machinery, sign important documents, or drink alcohol.  Contact your health care provider if you have a lot of blood in your stool, nausea or vomiting, a fever, or increased abdominal pain. This information is not intended to replace advice given to you by your health care provider. Make sure you discuss any questions you have with your  health care provider. Document Released: 12/11/2003 Document Revised: 02/18/2017 Document Reviewed: 07/10/2015 Elsevier Interactive Patient Education  2019 [employer] Inc[employer] Monitored Anesthesia Care, Care After These instructions provide you with information about caring for yourself after your procedure. Your health care provider may also give you more specific instructions. Your treatment has been planned according to current medical practices, but problems sometimes occur. Call your health care provider if you have any problems or questions after your procedure. What can I expect after the procedure? After your procedure, you may:  Feel sleepy for several hours.  Feel clumsy and have poor balance for several hours.  Feel forgetful about what happened after the procedure.  Have poor judgment for several hours.  Feel nauseous or vomit.  Have a sore throat if you had a breathing tube during the procedure. Follow these instructions at home: For at least 24 hours after the procedure:      Have a responsible adult stay with you. It is important to have someone help care for you until you are awake and alert.  Rest as needed.  Do not: ? Participate in activities in which you could fall or become injured. ? Drive. ? Use heavy machinery. ? Drink alcohol. ? Take sleeping pills or medicines that cause drowsiness. ? Make important decisions or sign legal documents. ? Take care of children on your own. Eating and drinking  Follow the diet that is recommended by your health care provider.  If you vomit, drink water, juice, or soup when you can drink without vomiting.  Make sure you have little or no nausea before eating solid foods. General instructions  Take over-the-counter and prescription medicines only as told by your health care provider.  If you have sleep apnea, surgery and certain medicines can increase your risk for breathing problems. Follow instructions from your health  care provider about wearing your sleep device: ? Anytime you are sleeping, including during daytime naps. ? While taking prescription pain medicines, sleeping medicines, or medicines that make you drowsy.  If you smoke, do not smoke without supervision.  Keep all follow-up visits as told by your health care provider. This is important. Contact a health care provider if:  You keep feeling nauseous or you keep vomiting.  You feel light-headed.  You develop a rash.  You have a fever. Get help right away if:  You have trouble breathing. Summary  For several hours after your procedure, you may feel sleepy and have poor judgment.  Have a responsible adult stay with you for at least 24 hours or until you are awake and alert. This information is not intended to replace advice given to you by your health care provider. Make sure you discuss any questions  you have with your health care provider. Document Released: 08/19/2015 Document Revised: 12/12/2016 Document Reviewed: 08/19/2015 Elsevier Interactive Patient Education  2019 [employer].

## 2018-07-22 ENCOUNTER — Encounter (HOSPITAL_COMMUNITY): Payer: Self-pay

## 2018-07-22 ENCOUNTER — Other Ambulatory Visit: Payer: Self-pay

## 2018-07-22 ENCOUNTER — Encounter (HOSPITAL_COMMUNITY)
Admission: RE | Admit: 2018-07-22 | Discharge: 2018-07-22 | Disposition: A | Discharge status: Home / Self Care | Payer: PRIVATE HEALTH INSURANCE | Source: Ambulatory Visit | Attending: Internal Medicine | Admitting: Internal Medicine

## 2018-07-22 DIAGNOSIS — Z01812 Encounter for preprocedural laboratory examination: Secondary | ICD-10-CM | POA: Insufficient documentation

## 2018-07-22 HISTORY — DX: Other chronic pain: G89.29

## 2018-07-22 HISTORY — DX: Gastro-esophageal reflux disease without esophagitis: K21.9

## 2018-07-22 HISTORY — DX: Dorsalgia, unspecified: M54.9

## 2018-07-22 LAB — BASIC METABOLIC PANEL
ANION GAP: 9 (ref 5–15)
BUN: 10 mg/dL (ref 8–23)
CALCIUM: 9 mg/dL (ref 8.9–10.3)
CO2: 26 mmol/L (ref 22–32)
Chloride: 102 mmol/L (ref 98–111)
Creatinine, Ser: 0.96 mg/dL (ref 0.61–1.24)
Glucose, Bld: 91 mg/dL (ref 70–99)
POTASSIUM: 4.2 mmol/L (ref 3.5–5.1)
SODIUM: 137 mmol/L (ref 135–145)

## 2018-07-26 ENCOUNTER — Telehealth: Payer: Self-pay | Admitting: Internal Medicine

## 2018-07-26 NOTE — Telephone Encounter (Signed)
LMOVM for pt 

## 2018-07-26 NOTE — Telephone Encounter (Signed)
Called patient and he has rescheduled to 09/20/2018 at 10:00am. New pre-op date is 5/4 at 12:45pm. I have mailed new instructions and new pre-op appt.

## 2018-07-26 NOTE — Telephone Encounter (Signed)
[phone]  PLEASE CALL PATIENT ABOUT RESCHEDULING HIS PROCEDURE, HE IS SICK

## 2018-09-10 NOTE — Patient Instructions (Signed)
[name]  09/10/2018     @PREFPERIOPPHARMACY @   Your procedure is scheduled on  09/20/2018.  Report to [name] at  815  A.M.  Call this number if you have problems the morning of surgery:  [phone]   Remember:  Follow the diet and prep instructions given to you by Dr Luvenia Starch office.                       Take these medicines the morning of surgery with A SIP OF WATER- Celexa, protonix.   Do not wear jewelry, make-up or nail polish.  Do not wear lotions, powders, or perfumes, or deodorant.  Do not shave 48 hours prior to surgery.  Men may shave face and neck.  Do not bring valuables to the hospital.  [facility] is not responsible for any belongings or valuables.  Contacts, dentures or bridgework may not be worn into surgery.  Leave your suitcase in the car.  After surgery it may be brought to your room.  For patients admitted to the hospital, discharge time will be determined by your treatment team.  Patients discharged the day of surgery will not be allowed to drive home.   Name and phone number of your driver:   family Special instructions:  None  Please read over the following fact sheets that you were given. Anesthesia Post-op Instructions and Care and Recovery After Surgery      Colonoscopy, Adult, Care After This sheet gives you information about how to care for yourself after your procedure. Your health care provider may also give you more specific instructions. If you have problems or questions, contact your health care provider. What can I expect after the procedure? After the procedure, it is common to have:  A small amount of blood in your stool for 24 hours after the procedure.  Some gas.  Mild abdominal cramping or bloating. Follow these instructions at home: General instructions  For the first 24 hours after the procedure: ? Do not drive or use machinery. ? Do not sign important documents. ? Do not drink alcohol. ? Do your regular  daily activities at a slower pace than normal. ? Eat soft, easy-to-digest foods.  Take over-the-counter or prescription medicines only as told by your health care provider. Relieving cramping and bloating   Try walking around when you have cramps or feel bloated.  Apply heat to your abdomen as told by your health care provider. Use a heat source that your health care provider recommends, such as a moist heat pack or a heating pad. ? Place a towel between your skin and the heat source. ? Leave the heat on for 20-30 minutes. ? Remove the heat if your skin turns bright red. This is especially important if you are unable to feel pain, heat, or cold. You may have a greater risk of getting burned. Eating and drinking   Drink enough fluid to keep your urine pale yellow.  Resume your normal diet as instructed by your health care provider. Avoid heavy or fried foods that are hard to digest.  Avoid drinking alcohol for as long as instructed by your health care provider. Contact a health care provider if:  You have blood in your stool 2-3 days after the procedure. Get help right away if:  You have more than a small spotting of blood in your stool.  You pass large blood clots in your stool.  Your abdomen is  swollen.  You have nausea or vomiting.  You have a fever.  You have increasing abdominal pain that is not relieved with medicine. Summary  After the procedure, it is common to have a small amount of blood in your stool. You may also have mild abdominal cramping and bloating.  For the first 24 hours after the procedure, do not drive or use machinery, sign important documents, or drink alcohol.  Contact your health care provider if you have a lot of blood in your stool, nausea or vomiting, a fever, or increased abdominal pain. This information is not intended to replace advice given to you by your health care provider. Make sure you discuss any questions you have with your health  care provider. Document Released: 12/11/2003 Document Revised: 02/18/2017 Document Reviewed: 07/10/2015 Elsevier Interactive Patient Education  2019 [employer]

## 2018-09-13 ENCOUNTER — Telehealth: Payer: Self-pay

## 2018-09-13 ENCOUNTER — Encounter (HOSPITAL_COMMUNITY): Payer: Self-pay

## 2018-09-13 ENCOUNTER — Encounter (HOSPITAL_COMMUNITY)
Admission: RE | Admit: 2018-09-13 | Discharge: 2018-09-13 | Disposition: A | Discharge status: Home / Self Care | Payer: PRIVATE HEALTH INSURANCE | Source: Ambulatory Visit | Attending: Internal Medicine | Admitting: Internal Medicine

## 2018-09-13 NOTE — Telephone Encounter (Signed)
Tried to call pt to reschedule TCS w/Propofol w/RMR that was for 09/20/18 d/t COVID-19 restrictions. Man answered phone and will have pt call office back.

## 2018-09-13 NOTE — Telephone Encounter (Signed)
Pt called office, TCS rescheduled to 11/04/18 at 10:45am.

## 2018-09-14 NOTE — Telephone Encounter (Signed)
Pre-op appt 10/29/18 at 11:00am. Appt letter mailed with new procedure instructions.

## 2018-10-25 NOTE — Patient Instructions (Signed)
Beatris SiJames D Hebdon  10/25/2018     @PREFPERIOPPHARMACY @   Your procedure is scheduled on  11/04/2018.  [facility]  [facility].M.  Call this number if you have problems the morning of surgery:  [phone]   Remember:  Follow the diet and prep instructions given to you by Dr Luvenia Starchourk's office.                     Take these medicines the morning of surgery with A SIP OF WATER  Celexa, protonix.    Do not wear jewelry, make-up or nail polish.  Do not wear lotions, powders, or perfumes, or deodorant.  Do not shave 48 hours prior to surgery.  Men may shave face and neck.  Do not bring valuables to the hospital.  [facility] is not responsible for any belongings or valuables.  Contacts, dentures or bridgework may not be worn into surgery.  Leave your suitcase in the car.  After surgery it may be brought to your room.  For patients admitted to the hospital, discharge time will be determined by your treatment team.  Patients discharged the day of surgery will not be allowed to drive home.   Name and phone number of your driver:   family Special instructions:  None  Please read over the following fact sheets that you were given. Anesthesia Post-op Instructions and Care and Recovery After Surgery       Colonoscopy, Adult, Care After This sheet gives you information about how to care for yourself after your procedure. Your health care provider may also give you more specific instructions. If you have problems or questions, contact your health care provider. What can I expect after the procedure? After the procedure, it is common to have:  A small amount of blood in your stool for 24 hours after the procedure.  Some gas.  Mild abdominal cramping or bloating. Follow these instructions at home: General instructions  For the first 24 hours after the procedure: ? Do not drive or use machinery. ? Do not sign important documents. ? Do not drink alcohol. ? Do your  regular daily activities at a slower pace than normal. ? Eat soft, easy-to-digest foods.  Take over-the-counter or prescription medicines only as told by your health care provider. Relieving cramping and bloating   Try walking around when you have cramps or feel bloated.  Apply heat to your abdomen as told by your health care provider. Use a heat source that your health care provider recommends, such as a moist heat pack or a heating pad. ? Place a towel between your skin and the heat source. ? Leave the heat on for 20-30 minutes. ? Remove the heat if your skin turns bright red. This is especially important if you are unable to feel pain, heat, or cold. You may have a greater risk of getting burned. Eating and drinking   Drink enough fluid to keep your urine pale yellow.  Resume your normal diet as instructed by your health care provider. Avoid heavy or fried foods that are hard to digest.  Avoid drinking alcohol for as long as instructed by your health care provider. Contact a health care provider if:  You have blood in your stool 2-3 days after the procedure. Get help right away if:  You have more than a small spotting of blood in your stool.  You pass large blood clots in your stool.  Your abdomen is swollen.  You have nausea or vomiting.  You have a fever.  You have increasing abdominal pain that is not relieved with medicine. Summary  After the procedure, it is common to have a small amount of blood in your stool. You may also have mild abdominal cramping and bloating.  For the first 24 hours after the procedure, do not drive or use machinery, sign important documents, or drink alcohol.  Contact your health care provider if you have a lot of blood in your stool, nausea or vomiting, a fever, or increased abdominal pain. This information is not intended to replace advice given to you by your health care provider. Make sure you discuss any questions you have with your  health care provider. Document Released: 12/11/2003 Document Revised: 02/18/2017 Document Reviewed: 07/10/2015 Elsevier Interactive Patient Education  2019 [place], Care After These instructions provide you with information about caring for yourself after your procedure. Your health care provider may also give you more specific instructions. Your treatment has been planned according to current medical practices, but problems sometimes occur. Call your health care provider if you have any problems or questions after your procedure. What can I expect after the procedure? After your procedure, you may:  Feel sleepy for several hours.  Feel clumsy and have poor balance for several hours.  Feel forgetful about what happened after the procedure.  Have poor judgment for several hours.  Feel nauseous or vomit.  Have a sore throat if you had a breathing tube during the procedure. Follow these instructions at home: For at least 24 hours after the procedure:      Have a responsible adult stay with you. It is important to have someone help care for you until you are awake and alert.  Rest as needed.  Do not: ? Participate in activities in which you could fall or become injured. ? Drive. ? Use heavy machinery. ? Drink alcohol. ? Take sleeping pills or medicines that cause drowsiness. ? Make important decisions or sign legal documents. ? Take care of children on your own. Eating and drinking  Follow the diet that is recommended by your health care provider.  If you vomit, drink water, juice, or soup when you can drink without vomiting.  Make sure you have little or no nausea before eating solid foods. General instructions  Take over-the-counter and prescription medicines only as told by your health care provider.  If you have sleep apnea, surgery and certain medicines can increase your risk for breathing problems. Follow instructions from your health  care provider about wearing your sleep device: ? Anytime you are sleeping, including during daytime naps. ? While taking prescription pain medicines, sleeping medicines, or medicines that make you drowsy.  If you smoke, do not smoke without supervision.  Keep all follow-up visits as told by your health care provider. This is important. Contact a health care provider if:  You keep feeling nauseous or you keep vomiting.  You feel light-headed.  You develop a rash.  You have a fever. Get help right away if:  You have trouble breathing. Summary  For several hours after your procedure, you may feel sleepy and have poor judgment.  Have a responsible adult stay with you for at least 24 hours or until you are awake and alert. This information is not intended to replace advice given to you by your health care provider. Make sure you discuss any questions you have with your health  care provider. Document Released: 08/19/2015 Document Revised: 12/12/2016 Document Reviewed: 08/19/2015 Elsevier Interactive Patient Education  2019 [employer].

## 2018-10-26 ENCOUNTER — Telehealth: Payer: Self-pay | Admitting: Internal Medicine

## 2018-10-26 NOTE — Telephone Encounter (Signed)
Endo scheduler called office and aware to cancel procedure.  FYI to EG.

## 2018-10-26 NOTE — Telephone Encounter (Signed)
Noted  

## 2018-10-26 NOTE — Telephone Encounter (Signed)
Pt wants to cancel his pre op and procedure due to covid and will reschedule later

## 2018-10-29 ENCOUNTER — Encounter (HOSPITAL_COMMUNITY): Payer: Self-pay

## 2018-10-29 ENCOUNTER — Inpatient Hospital Stay (HOSPITAL_COMMUNITY)
Admission: RE | Admit: 2018-10-29 | Discharge: 2018-10-29 | Disposition: A | Discharge status: Home / Self Care | Payer: PRIVATE HEALTH INSURANCE | Source: Ambulatory Visit

## 2018-11-04 ENCOUNTER — Ambulatory Visit (HOSPITAL_COMMUNITY)
Admission: RE | Admit: 2018-11-04 | Payer: PRIVATE HEALTH INSURANCE | Source: Home / Self Care | Admitting: Internal Medicine

## 2018-11-04 ENCOUNTER — Encounter (HOSPITAL_COMMUNITY): Admission: RE | Payer: Self-pay | Source: Home / Self Care

## 2018-11-04 SURGERY — COLONOSCOPY WITH PROPOFOL
Anesthesia: Monitor Anesthesia Care

## 2018-11-23 ENCOUNTER — Telehealth: Payer: Self-pay | Admitting: Internal Medicine

## 2018-11-23 NOTE — Telephone Encounter (Signed)
Patient called to reschedule his procedure.  Stated he has had multiple cancels due to the virus.  Did not know weather he needed to come in the office again or could just be scheduled?

## 2018-11-23 NOTE — Telephone Encounter (Signed)
LMOVM for pt. Patient cancelled 1st time d/t being sick and he has r/s'd the other times on his own as well. Needs OV to r/s.

## 2019-05-31 ENCOUNTER — Telehealth: Payer: Self-pay

## 2019-05-31 NOTE — Congregational Nurse Program (Signed)
  Dept: [phone]   Congregational Nurse Program Note  Date of Encounter: 05/31/2019  Past Medical History: Past Medical History:  Diagnosis Date  . Alcohol abuse   . Bipolar disorder (HCC)   . Chronic back pain   . GERD (gastroesophageal reflux disease)   . Hallucinations   . Homeless    pt says he now lives in his father's farm house  . Hx of medication noncompliance   . OCD (obsessive compulsive disorder)   . Paranoid ideation [facility])     Encounter Details: CNP Questionnaire - 05/30/19 1600      Questionnaire   Patient Status  Not Applicable    Race  White or Caucasian    Location Patient Served At  [facility]  Not Applicable    Uninsured  Uninsured (NEW 1x/quarter)    Food  No food insecurities    Housing/Utilities  Yes, have permanent housing    Transportation  No transportation needs    Interpersonal Safety  Yes, feel physically and emotionally safe where you currently live    Medication  Yes, have medication insecurities;Provided medication assistance    Medical Provider  No    Referrals  Medication Assistance;[facility] [facility]/Care Connects;Primary Care Provider/Clinic    ED Visit Averted  Not Applicable    Life-Saving Intervention Made  Not Applicable       Client in on 05/24/19 to enroll in Care Connect as well as [place] [facility] with Annamarie Major [address] [facility] Worker.. per her notes [place] [facility] Assist was faxed on day of enrollment.  Client called today stating he has been out of his medications for many days and cannot afford them at [facility].  Medications are  Pantoprazole Methocarbamol  Client with history of GERD and lower back pain. He states these are the only medications that he is currently taking. Client no longer has insurance and cannot afford these Medications.  Discussed with client that neither medication is listed on formulary for [facility] and also not available from list of [facility] program. Encouraged  client to discuss with his new provider about being able to afford these medications and provider will determine if any changes are appropriate. Client is agreeable.  Discussed with client that the [facility] center can offer a one time assistance with these medications until he can establish with [facility]. Will need to have [facility] transfer medications to them. Client agreeable. Faxed Voucher for a one time approval for pantoprazole and methocarbamol to [facility] and pharmacy called and RN/CM requested the transfer.   Will plan follow up call to verify that client was able to obtain above medications.    ADDENDUM 05/31/19 client was able to pick up his medications at [facility]. Plan to contact [facility] to confirm enrollment and note for provider that confirmation.   Will continue to follow as Case Manager for Care Connect program.  [name] RN [facility]/ Care Connect program

## 2019-05-31 NOTE — Telephone Encounter (Signed)
Client called stating that he has medications at [facility] that he has not been able to take due to financial difficulty. Medications are pantoprazole and Methocarbamol. Discussed with client that will plan to assess how we may assist him. Client is to [facility]. Encouraged client to have a conversation regarding not affording medications. Client recently enrolled with Care Connect and [place] [facility]. Discussed with client not all medications are available with [facility] and on review those two are not listed. Discussed that provider would be able to determine if he could be changed to another medication through Med[facility] or more affordable. Client agreeable. Will determine assistance that may be provided and contact client . Client agreeable.   Norval Gable RN

## 2019-06-09 ENCOUNTER — Ambulatory Visit: Payer: Self-pay | Admitting: Physician Assistant

## 2019-06-15 ENCOUNTER — Encounter: Payer: Self-pay | Admitting: Physician Assistant

## 2019-06-15 ENCOUNTER — Other Ambulatory Visit: Payer: Self-pay

## 2019-06-15 ENCOUNTER — Ambulatory Visit: Payer: PRIVATE HEALTH INSURANCE | Admitting: Physician Assistant

## 2019-06-15 ENCOUNTER — Other Ambulatory Visit: Payer: Self-pay | Admitting: Physician Assistant

## 2019-06-15 VITALS — BP 122/80 | HR 87 | Temp 97.7°F | Wt 155.3 lb

## 2019-06-15 DIAGNOSIS — Z125 Encounter for screening for malignant neoplasm of prostate: Secondary | ICD-10-CM

## 2019-06-15 DIAGNOSIS — F489 Nonpsychotic mental disorder, unspecified: Secondary | ICD-10-CM

## 2019-06-15 DIAGNOSIS — Z7689 Persons encountering health services in other specified circumstances: Secondary | ICD-10-CM

## 2019-06-15 DIAGNOSIS — K219 Gastro-esophageal reflux disease without esophagitis: Secondary | ICD-10-CM

## 2019-06-15 DIAGNOSIS — Z1211 Encounter for screening for malignant neoplasm of colon: Secondary | ICD-10-CM

## 2019-06-15 DIAGNOSIS — Z13220 Encounter for screening for lipoid disorders: Secondary | ICD-10-CM

## 2019-06-15 DIAGNOSIS — G8929 Other chronic pain: Secondary | ICD-10-CM

## 2019-06-15 DIAGNOSIS — F17200 Nicotine dependence, unspecified, uncomplicated: Secondary | ICD-10-CM

## 2019-06-15 MED ORDER — DICLOFENAC-MISOPROSTOL 75-0.2 MG PO TBEC: 1.0000 | DELAYED_RELEASE_TABLET | Freq: Two times a day (BID) | ORAL | 0 refills | Status: DC | PRN

## 2019-06-15 MED ORDER — CYCLOBENZAPRINE HCL 10 MG PO TABS: 10.0000 mg | ORAL_TABLET | Freq: Three times a day (TID) | ORAL | 0 refills | Status: DC | PRN

## 2019-06-15 MED ORDER — OMEPRAZOLE 40 MG PO CPDR: 40.0000 mg | DELAYED_RELEASE_CAPSULE | Freq: Every day | ORAL | 0 refills | Status: DC

## 2019-06-15 NOTE — Progress Notes (Signed)
BP 122/80   Pulse 87   Temp 97.7 F (36.5 C)   Wt 155 lb 4.8 oz (70.4 kg)   SpO2 98%   BMI 23.61 kg/m    Subjective:    Patient ID: [name], male    DOB: 16-Dec-1955, 64 y.o.   MRN: [id number]  HPI: [name] is a 64 y.o. male presenting on 06/15/2019 for New Patient (Initial Visit) (previous pt at [facility] clinic before his insurance ran out. pt states he last was seen there about 6 months ago)   HPI  Pt had a negative covid 19 screening questionnaire   Chief Complaint  Patient presents with  . New Patient (Initial Visit)    previous pt at [facility] clinic before his insurance ran out. pt states he last was seen there about 6 months ago     + family hx colon CA (Father diagnosed age 21).  Pt never had colonoscopy- was schedued in march 2020 but he rescheduled because he was sick.   He then r/s several more times and was told he needed to RTO before it was scheduled again and he never went in.    Hx etoh, MJ, Bipolar, OCD, paranoid ideation, hallucinations  He has GERD- on protonix.  he Was on omeprazole in past.    xrays 2018 (in Epic) - DDD lumbar and degen changes cspine   Pt says his insurance ran out last year sometime.   He plans to go to [facility] as soon as he can get transportation.  He has [facility] information.   Pt says robaxin for chronic LBP and now his knee hurts also  He says neurosurgeon recommended  injections.  Dr [name] in [place]      Relevant past medical, surgical, family and social history reviewed and updated as indicated. Interim medical history since our last visit reviewed. Allergies and medications reviewed and updated.      Current Outpatient Medications:  .  Acetaminophen-Aspirin Buffered 250-250 MG tablet, Take 1 tablet by mouth every 4 (four) hours as needed for fever., Disp: , Rfl:  .  Ascorbic Acid (VITAMIN C) 100 MG tablet, Take 500 mg by mouth daily., Disp: , Rfl:  .  methocarbamol (ROBAXIN) 500 MG tablet, Take 500  mg by mouth 2 (two) times daily as needed for muscle spasms., Disp: , Rfl:  .  pantoprazole (PROTONIX) 20 MG tablet, Take 1 tablet by mouth daily., Disp: , Rfl:    Review of Systems  Per HPI unless specifically indicated above     Objective:    BP 122/80   Pulse 87   Temp 97.7 F (36.5 C)   Wt 155 lb 4.8 oz (70.4 kg)   SpO2 98%   BMI 23.61 kg/m   Wt Readings from Last 3 Encounters:  06/15/19 155 lb 4.8 oz (70.4 kg)  07/22/18 161 lb (73 kg)  06/09/18 165 lb 9.6 oz (75.1 kg)    Physical Exam Vitals reviewed.  Constitutional:      General: He is not in acute distress.    Appearance: Normal appearance. He is well-developed. He is not ill-appearing or toxic-appearing.  HENT:     Head: Normocephalic and atraumatic.  Eyes:     Conjunctiva/sclera: Conjunctivae normal.     Pupils: Pupils are equal, round, and reactive to light.  Neck:     Thyroid: No thyromegaly.  Cardiovascular:     Rate and Rhythm: Normal rate and regular rhythm.  Pulmonary:  Effort: Pulmonary effort is normal.     Breath sounds: Normal breath sounds. No wheezing or rales.  Abdominal:     General: Bowel sounds are normal.     Palpations: Abdomen is soft. There is no mass.     Tenderness: There is no abdominal tenderness.  Musculoskeletal:     Cervical back: Neck supple.     Right lower leg: No edema.     Left lower leg: No edema.     Left foot: Bunion present.  Lymphadenopathy:     Cervical: No cervical adenopathy.  Skin:    General: Skin is warm and dry.     Findings: No rash.  Neurological:     Mental Status: He is alert and oriented to person, place, and time.     Motor: No tremor.     Gait: Gait is intact.  Psychiatric:        Attention and Perception: Attention normal.        Mood and Affect: Mood normal.        Speech: Speech normal.        Behavior: Behavior normal. Behavior is cooperative.         Assessment & Plan:    Encounter Diagnoses  Name Primary?  . Encounter to  establish care Yes  . Screening for malignant neoplasm of prostate   . Screening for colon cancer   . Screening cholesterol level   . Mental health problem   . Gastroesophageal reflux disease, unspecified whether esophagitis present   . Chronic back pain, unspecified back location, unspecified back pain laterality   . Tobacco use disorder      -Pt to Get to [facility]- pt has contact information  -rx Omeprazole to [facility] (protonix unavaialbale) -rx Flexeril to [facility] (robaxin unavailable) -Trial of arthrotec for pain with GI protection  -Records requested from neurosurgeon  -Pt is given ifobt for colon cancer screening.  He is good with this option in light of difficulties with trying to get colonoscopy during covid last year.  -Check baseline labs/ lipds and cmp psa  -discused covid vaccine (he doesn't want it)  -pt to follow up  1 month.  He is to contact office sooner prn   After pt left office, records received from [place], he recomended MRI in order to determine treatment (injections, surgery, etc)

## 2019-06-29 ENCOUNTER — Other Ambulatory Visit: Payer: Self-pay

## 2019-06-29 ENCOUNTER — Other Ambulatory Visit (HOSPITAL_COMMUNITY)
Admission: RE | Admit: 2019-06-29 | Discharge: 2019-06-29 | Disposition: A | Discharge status: Home / Self Care | Payer: PRIVATE HEALTH INSURANCE | Source: Ambulatory Visit | Attending: Physician Assistant | Admitting: Physician Assistant

## 2019-06-29 DIAGNOSIS — Z13220 Encounter for screening for lipoid disorders: Secondary | ICD-10-CM | POA: Insufficient documentation

## 2019-06-29 DIAGNOSIS — Z125 Encounter for screening for malignant neoplasm of prostate: Secondary | ICD-10-CM | POA: Insufficient documentation

## 2019-06-29 LAB — COMPREHENSIVE METABOLIC PANEL
ALT: 28 U/L (ref 0–44)
AST: 46 U/L — ABNORMAL HIGH (ref 15–41)
Albumin: 4.2 g/dL (ref 3.5–5.0)
Alkaline Phosphatase: 56 U/L (ref 38–126)
Anion gap: 10 (ref 5–15)
BUN: 12 mg/dL (ref 8–23)
CO2: 29 mmol/L (ref 22–32)
Calcium: 9.3 mg/dL (ref 8.9–10.3)
Chloride: 100 mmol/L (ref 98–111)
Creatinine, Ser: 0.87 mg/dL (ref 0.61–1.24)
GFR calc Af Amer: >60 mL/min (ref >60)
GFR calc non Af Amer: >60 mL/min (ref >60)
Glucose, Bld: 86 mg/dL (ref 70–99)
Potassium: 4.2 mmol/L (ref 3.5–5.1)
Sodium: 139 mmol/L (ref 135–145)
Total Bilirubin: 0.5 mg/dL (ref 0.3–1.2)
Total Protein: 8.3 g/dL — ABNORMAL HIGH (ref 6.5–8.1)

## 2019-06-29 LAB — LIPID PANEL
Cholesterol: 202 mg/dL — ABNORMAL HIGH (ref 0–200)
HDL: 78 mg/dL (ref >40)
LDL Cholesterol: 109 mg/dL — ABNORMAL HIGH (ref 0–99)
Total CHOL/HDL Ratio: 2.6 RATIO
Triglycerides: 75 mg/dL (ref <150)
VLDL: 15 mg/dL (ref 0–40)

## 2019-06-29 LAB — PSA: Prostatic Specific Antigen: 2.17 ng/mL (ref 0.00–4.00)

## 2019-07-13 ENCOUNTER — Ambulatory Visit: Payer: Self-pay | Admitting: Physician Assistant

## 2019-07-13 ENCOUNTER — Encounter: Payer: Self-pay | Admitting: Physician Assistant

## 2019-07-13 DIAGNOSIS — R7989 Other specified abnormal findings of blood chemistry: Secondary | ICD-10-CM

## 2019-07-13 DIAGNOSIS — F489 Nonpsychotic mental disorder, unspecified: Secondary | ICD-10-CM

## 2019-07-13 DIAGNOSIS — F17200 Nicotine dependence, unspecified, uncomplicated: Secondary | ICD-10-CM

## 2019-07-13 DIAGNOSIS — G8929 Other chronic pain: Secondary | ICD-10-CM

## 2019-07-13 DIAGNOSIS — K219 Gastro-esophageal reflux disease without esophagitis: Secondary | ICD-10-CM

## 2019-07-13 NOTE — Progress Notes (Signed)
There were no vitals taken for this visit.   Subjective:    Patient ID: [name], male    DOB: Oct 07, 1955, 64 y.o.   MRN: [id number]  HPI: [name] is a 64 y.o. male presenting on 07/13/2019 for No chief complaint on file.   HPI   This is a telemedicine appointment due to coronavirus pandemic.  It is via Telephone as pt does not have a video enabled device.    I connected with  [name] on 07/13/19  by a video enabled telemedicine application and verified that I am speaking with the correct person using two identifiers.   I discussed the limitations of evaluation and management by telemedicine. The patient expressed understanding and agreed to proceed.  Pt is at home.  Provider is at office.    Pt is a 63yoM who presented to office last month to establish care.  He has significant MH history and chronic problems with his back.  .  He has seen dr Sherwood Gambler (neurosurgery) for his back in the past.  Pt has not been approved for [facility] (medication assistance program) yet due to not submitting required paperwork.  He hasn't been to [facility] as recomended.  He says it's procrastination is the only reason why not.     Relevant past medical, surgical, family and social history reviewed and updated as indicated. Interim medical history since our last visit reviewed. Allergies and medications reviewed and updated.  CURRENT MEDS: Vit c ibu Robaxin protonix  Review of Systems  Per HPI unless specifically indicated above     Objective:    There were no vitals taken for this visit.  Wt Readings from Last 3 Encounters:  06/15/19 155 lb 4.8 oz (70.4 kg)  07/22/18 161 lb (73 kg)  06/09/18 165 lb 9.6 oz (75.1 kg)    Physical Exam Pulmonary:     Effort: No respiratory distress.  Neurological:     Mental Status: He is alert and oriented to person, place, and time.  Psychiatric:        Attention and Perception: Attention normal.        Speech: Speech normal.      Behavior: Behavior is cooperative.     Results for orders placed or performed during the hospital encounter of 06/29/19  Lipid panel  Result Value Ref Range   Cholesterol 202 (H) 0 - 200 mg/dL   Triglycerides 75 <150 mg/dL   HDL 78 >40 mg/dL   Total CHOL/HDL Ratio 2.6 RATIO   VLDL 15 0 - 40 mg/dL   LDL Cholesterol 109 (H) 0 - 99 mg/dL  Comprehensive metabolic panel  Result Value Ref Range   Sodium 139 135 - 145 mmol/L   Potassium 4.2 3.5 - 5.1 mmol/L   Chloride 100 98 - 111 mmol/L   CO2 29 22 - 32 mmol/L   Glucose, Bld 86 70 - 99 mg/dL   BUN 12 8 - 23 mg/dL   Creatinine, Ser 0.87 0.61 - 1.24 mg/dL   Calcium 9.3 8.9 - 10.3 mg/dL   Total Protein 8.3 (H) 6.5 - 8.1 g/dL   Albumin 4.2 3.5 - 5.0 g/dL   AST 46 (H) 15 - 41 U/L   ALT 28 0 - 44 U/L   Alkaline Phosphatase 56 38 - 126 U/L   Total Bilirubin 0.5 0.3 - 1.2 mg/dL   GFR calc non Af Amer >60 >60 mL/min   GFR calc Af Amer >60 >60 mL/min  Anion gap 10 5 - 15  PSA  Result Value Ref Range   Prostatic Specific Antigen 2.17 0.00 - 4.00 ng/mL      Assessment & Plan:   Encounter Diagnoses  Name Primary?  . Chronic back pain, unspecified back location, unspecified back pain laterality Yes  . Gastroesophageal reflux disease, unspecified whether esophagitis present   . Mental health problem   . Tobacco use disorder   . Elevated LFTs      -reviewed labs with pt -counseled on Lowfat diet to help mildly elevated lipids -encouraged pt to Limit etoh -urged pt to Get to [facility] for MH treatment -will Order xray lower back to get CAFA./ [facility] financial assistance.  Pt is mailed application for assistance.   -will Refer to orthopedist for evaluation and treatment of back issues.  Pt will be referred to specialist in [place] as he says he has difficulties sometimes with transportation to [facility] -pt is reminded to return iFOBT for colon cancer screening that he was given at his February appointment -pt to follow up in 3  months.  He is to contact office sooner for any changes or new problems.

## 2019-07-18 ENCOUNTER — Other Ambulatory Visit: Payer: Self-pay

## 2019-07-18 ENCOUNTER — Ambulatory Visit (HOSPITAL_COMMUNITY)
Admission: RE | Admit: 2019-07-18 | Discharge: 2019-07-18 | Disposition: A | Discharge status: Home / Self Care | Payer: Self-pay | Source: Ambulatory Visit | Attending: Physician Assistant | Admitting: Physician Assistant

## 2019-07-18 DIAGNOSIS — M549 Dorsalgia, unspecified: Secondary | ICD-10-CM | POA: Insufficient documentation

## 2019-07-18 DIAGNOSIS — G8929 Other chronic pain: Secondary | ICD-10-CM | POA: Insufficient documentation

## 2019-10-17 ENCOUNTER — Ambulatory Visit: Payer: Self-pay | Admitting: Physician Assistant

## 2019-10-17 ENCOUNTER — Encounter: Payer: Self-pay | Admitting: Physician Assistant

## 2019-10-17 ENCOUNTER — Other Ambulatory Visit: Payer: Self-pay

## 2019-10-17 VITALS — BP 120/86 | HR 76 | Temp 97.9°F | Ht 65.0 in | Wt 155.0 lb

## 2019-10-17 DIAGNOSIS — M549 Dorsalgia, unspecified: Secondary | ICD-10-CM

## 2019-10-17 DIAGNOSIS — G8929 Other chronic pain: Secondary | ICD-10-CM

## 2019-10-17 NOTE — Progress Notes (Signed)
   BP 120/86   Pulse 76   Temp 97.9 F (36.6 C)   Ht 5\' 5"  (1.651 m)   Wt 155 lb (70.3 kg)   SpO2 97%   BMI 25.79 kg/m    Subjective:    Patient ID: [name], male    DOB: [birth year], 64 y.o.   MRN: [id number]  HPI: [name] is a 64 y.o. male presenting on 10/17/2019 for Follow-up   HPI   Pt had a negative covid 19 screening questionnaire.   Pt is 63yoM with chronic back problems.   He was referred to orthopedics but they were unable to contact pt.    Pt doesn't have a phone.  He says He dropped his in the river.    Pt stopped smoking.  He is Still drinking 1 or 2 daily  He didn't contact [facility] as recommended for MH issues.  He says he is doing okay off his citalopram  Pt says stomach better since he decreased his IBU.        Relevant past medical, surgical, family and social history reviewed and updated as indicated. Interim medical history since our last visit reviewed. Allergies and medications reviewed and updated.   CURRENT MEDS: IBU Vit C   Review of Systems  Per HPI unless specifically indicated above     Objective:    BP 120/86   Pulse 76   Temp 97.9 F (36.6 C)   Ht 5\' 5"  (1.651 m)   Wt 155 lb (70.3 kg)   SpO2 97%   BMI 25.79 kg/m   Wt Readings from Last 3 Encounters:  10/17/19 155 lb (70.3 kg)  06/15/19 155 lb 4.8 oz (70.4 kg)  07/22/18 161 lb (73 kg)    Physical Exam Vitals reviewed.  Constitutional:      General: He is not in acute distress.    Appearance: He is well-developed.  HENT:     Head: Normocephalic and atraumatic.  Cardiovascular:     Rate and Rhythm: Normal rate and regular rhythm.  Pulmonary:     Effort: Pulmonary effort is normal.     Breath sounds: Normal breath sounds. No wheezing.  Abdominal:     General: Bowel sounds are normal.     Palpations: Abdomen is soft.     Tenderness: There is no abdominal tenderness.  Musculoskeletal:     Cervical back: Neck supple.     Right lower leg: No edema.   Left lower leg: No edema.  Lymphadenopathy:     Cervical: No cervical adenopathy.  Skin:    General: Skin is warm and dry.  Neurological:     Mental Status: He is alert and oriented to person, place, and time.  Psychiatric:        Attention and Perception: Attention normal.        Speech: Speech normal.        Behavior: Behavior normal. Behavior is cooperative.         Assessment & Plan:    Encounter Diagnosis  Name Primary?  . Chronic back pain, unspecified back location, unspecified back pain laterality Yes     -sent message to [facility] requesting check on CAFA -Pt to call orthopedics since they were unable to get in touch with him -Pt to contact [facility] -pt congratulated on stopping smoking and is encouraged to remain a non-smoker -pt to follow up 4 months.  Pt to contact office sooner prn

## 2019-10-17 NOTE — Patient Instructions (Addendum)
Orthopedics  office - [phone]

## 2020-02-20 ENCOUNTER — Ambulatory Visit: Payer: Self-pay | Admitting: Physician Assistant

## 2020-02-28 ENCOUNTER — Other Ambulatory Visit: Payer: Self-pay

## 2020-02-28 ENCOUNTER — Ambulatory Visit: Payer: Self-pay | Admitting: Physician Assistant

## 2020-02-28 ENCOUNTER — Encounter: Payer: Self-pay | Admitting: Physician Assistant

## 2020-02-28 VITALS — BP 160/100 | HR 98 | Temp 98.5°F

## 2020-02-28 DIAGNOSIS — R030 Elevated blood-pressure reading, without diagnosis of hypertension: Secondary | ICD-10-CM

## 2020-02-28 DIAGNOSIS — K219 Gastro-esophageal reflux disease without esophagitis: Secondary | ICD-10-CM

## 2020-02-28 DIAGNOSIS — G8929 Other chronic pain: Secondary | ICD-10-CM

## 2020-02-28 DIAGNOSIS — F489 Nonpsychotic mental disorder, unspecified: Secondary | ICD-10-CM

## 2020-02-28 DIAGNOSIS — M549 Dorsalgia, unspecified: Secondary | ICD-10-CM

## 2020-02-28 MED ORDER — OMEPRAZOLE 40 MG PO CPDR: 40.0000 mg | DELAYED_RELEASE_CAPSULE | Freq: Every day | ORAL | 0 refills | Status: DC

## 2020-02-28 MED ORDER — DICLOFENAC-MISOPROSTOL 75-0.2 MG PO TBEC: 1.0000 | DELAYED_RELEASE_TABLET | Freq: Two times a day (BID) | ORAL | 0 refills | Status: DC | PRN

## 2020-02-28 MED ORDER — CYCLOBENZAPRINE HCL 10 MG PO TABS: 10.0000 mg | ORAL_TABLET | Freq: Three times a day (TID) | ORAL | 0 refills | Status: DC | PRN

## 2020-02-28 NOTE — Progress Notes (Signed)
BP (!) 160/100    Pulse 98    Temp 98.5 F (36.9 C)    SpO2 98%    Subjective:    Patient ID: [name], male    DOB: [birth year], 64 y.o.   MRN: [id number]  HPI: [name] is a 64 y.o. male presenting on 02/28/2020 for No chief complaint on file.   HPI   Pt had a negative covid 19 screening questionnaire.   Pt is 64yoM with appointment for routine follow up.  He Hasn't been to [facility].  He was encouraged at last appointment to do so.  He Says he "flip-flops".  He says he's bipolar.    He has Not gotten covid vaccination.  He didn't call orthopedics as encouraged to do at last appointment.  He says he was cutting wood yesterday and felt a twinge but was okay otherwise.  Pt admits he's not doing too bad if he was outside splitting wood yesterday.     He says his Stomach is doing okay as long as he watches what he eats.  He has stayed off cigarettes which he stopped earlier this year.    He says he has Cut back coffee but says he did drink a cup just prior to his appointment today.  He says hIs sleeping is better  Pt says his biggest problem is he doesn't have a license.  He lost it due to etoh.  He drinks brandy an averaged of 7 drinks/week     Relevant past medical, surgical, family and social history reviewed and updated as indicated. Interim medical history since our last visit reviewed. Allergies and medications reviewed and updated.   Current Outpatient Medications:    cyclobenzaprine (FLEXERIL) 10 MG tablet, Take 1 tablet (10 mg total) by mouth 3 (three) times daily as needed for muscle spasms., Disp: 90 tablet, Rfl: 0   Diclofenac-miSOPROStol 75-0.2 MG TBEC, Take 1 tablet by mouth 2 (two) times daily as needed., Disp: 180 tablet, Rfl: 0   omeprazole (PRILOSEC) 40 MG capsule, Take 1 capsule (40 mg total) by mouth daily., Disp: 90 capsule, Rfl: 0    Review of Systems  Per HPI unless specifically indicated above     Objective:    BP (!) 160/100     Pulse 98    Temp 98.5 F (36.9 C)    SpO2 98%   Wt Readings from Last 3 Encounters:  10/17/19 155 lb (70.3 kg)  06/15/19 155 lb 4.8 oz (70.4 kg)  07/22/18 161 lb (73 kg)    Physical Exam Vitals reviewed.  Constitutional:      General: He is not in acute distress.    Appearance: He is well-developed. He is not ill-appearing.  HENT:     Head: Normocephalic and atraumatic.  Cardiovascular:     Rate and Rhythm: Normal rate and regular rhythm.  Pulmonary:     Effort: Pulmonary effort is normal.     Breath sounds: Normal breath sounds. No wheezing.  Abdominal:     General: Bowel sounds are normal.     Palpations: Abdomen is soft.     Tenderness: There is no abdominal tenderness.  Musculoskeletal:     Cervical back: Neck supple.     Right lower leg: No edema.     Left lower leg: No edema.  Lymphadenopathy:     Cervical: No cervical adenopathy.  Skin:    General: Skin is warm and dry.  Neurological:     Mental Status:  He is alert and oriented to person, place, and time.  Psychiatric:        Attention and Perception: Attention normal.        Speech: Speech normal.        Behavior: Behavior normal. Behavior is cooperative.            Assessment & Plan:    Encounter Diagnoses  Name Primary?   Chronic back pain, unspecified back location, unspecified back pain laterality Yes   Gastroesophageal reflux disease, unspecified whether esophagitis present    Mental health problem    Elevated blood pressure reading      -discussed elevated BP.  His bp has always been good prior to today.  He thinks it may be elevated today due to he just walked up here and had a cup of coffee.  He will not be started on medication but he will be seen in 2 weeks to recheck the BP.  He is in agreement with plan.  -pt is Reminded pt to return iFOBT for colon cancer screening -Pt counseled and educated about covid vaccination and is encouraged to get it  -pt is encouaraged to contact [facility]  for counseling and help with his bipolar Pt to follow up 2 weeks.  He is to contact office sooner prn.

## 2020-03-13 ENCOUNTER — Ambulatory Visit: Payer: Self-pay | Admitting: Physician Assistant

## 2020-06-06 ENCOUNTER — Ambulatory Visit: Payer: Self-pay | Admitting: Physician Assistant

## 2020-06-07 ENCOUNTER — Encounter: Payer: Self-pay | Admitting: Physician Assistant

## 2020-06-07 ENCOUNTER — Ambulatory Visit: Payer: Self-pay | Admitting: Physician Assistant

## 2020-06-07 VITALS — BP 120/82 | HR 104 | Temp 97.9°F

## 2020-06-07 DIAGNOSIS — G8929 Other chronic pain: Secondary | ICD-10-CM

## 2020-06-07 DIAGNOSIS — M545 Low back pain, unspecified: Secondary | ICD-10-CM

## 2020-06-07 DIAGNOSIS — W19XXXA Unspecified fall, initial encounter: Secondary | ICD-10-CM

## 2020-06-07 DIAGNOSIS — M549 Dorsalgia, unspecified: Secondary | ICD-10-CM

## 2020-06-07 DIAGNOSIS — R630 Anorexia: Secondary | ICD-10-CM

## 2020-06-07 DIAGNOSIS — F17200 Nicotine dependence, unspecified, uncomplicated: Secondary | ICD-10-CM

## 2020-06-07 MED ORDER — GABAPENTIN 300 MG PO CAPS: 300.0000 mg | ORAL_CAPSULE | Freq: Three times a day (TID) | ORAL | 0 refills | Status: AC | PRN

## 2020-06-07 NOTE — Progress Notes (Signed)
BP 120/82   Pulse (!) 104   Temp 97.9 F (36.6 C)   SpO2 99%    Subjective:    Patient ID: [name], male    DOB: 03-05-1956, 65 y.o.   MRN: [id number]  HPI: [name] is a 65 y.o. male presenting on 06/07/2020 for No chief complaint on file.   HPI    Pt had a negative covid 19 screening questionnaire.  Chief Complaint  Patient presents with  . Back Pain    Pt fell a total of 5 time in one weeks time when there was ice and snow. Pt states the worse time. His feet fell forward and pt landed on his back. Pt states his back huts to sit up, lay down,and walk. Pt as been taking rx meds which is no longer helping.      Pt had initial appointment here 06/15/2019.  Pt was seen for chronic back pain 07/13/19 Pt ws seen for chronic back pain 10/17/19 Pt was seen for chronic back pain 10//19/21  Pt was referred to orthopedist at appt 07/13/19.  Notes on referral say that pt declined to make appt because he wanted an injection and [facility] doesn't do those.  Pt says he has fallen 5 times on the ice.  He says he fell again today.  Pt has not gotten the covid vaccination.  He says that I told him he didn't  Need covid vaccination because he is so healthy.  This of course is flatly not true.  Pt states he is not eating good due to gerd bothering him.   He did not bring back his ifobt given to him feb 2021 for colon cancer screening.      Pt told nurse he quit taking his omeprazole but he tells me he is still taking it.   Unable to get weight today due to back pain.  Pt told nurse he was taking flexeril but tells me that he stopped it.       Relevant past medical, surgical, family and social history reviewed and updated as indicated. Interim medical history since our last visit reviewed. Allergies and medications reviewed and updated.   Current Outpatient Medications:  .  cyclobenzaprine (FLEXERIL) 10 MG tablet, Take 1 tablet (10 mg total) by mouth 3 (three) times  daily as needed for muscle spasms., Disp: 90 tablet, Rfl: 0 .  Diclofenac-miSOPROStol 75-0.2 MG TBEC, Take 1 tablet by mouth 2 (two) times daily as needed., Disp: 180 tablet, Rfl: 0 .  omeprazole (PRILOSEC) 40 MG capsule, Take 1 capsule (40 mg total) by mouth daily. (Patient not taking: Reported on 06/07/2020), Disp: 90 capsule, Rfl: 0    Review of Systems  Per HPI unless specifically indicated above     Objective:    BP 120/82   Pulse (!) 104   Temp 97.9 F (36.6 C)   SpO2 99%   Wt Readings from Last 3 Encounters:  10/17/19 155 lb (70.3 kg)  06/15/19 155 lb 4.8 oz (70.4 kg)  07/22/18 161 lb (73 kg)    Physical Exam Constitutional:      General: He is not in acute distress.    Appearance: He is not toxic-appearing.  HENT:     Head: Normocephalic and atraumatic.  Cardiovascular:     Rate and Rhythm: Normal rate and regular rhythm.  Pulmonary:     Effort: Pulmonary effort is normal. No respiratory distress.  Musculoskeletal:     Lumbar back: Tenderness  present. Positive right straight leg raise test and positive left straight leg raise test.     Comments: Exam difficult.  No point tenderness.    Neurological:     Mental Status: He is alert and oriented to person, place, and time.     Gait: Gait abnormal.     Comments: Pt is attempting to ambulate with a rolling walker but is only holding it with one hand because he is trying to hold up his pants and keep his fly closed with his other hand.  This is because his pants are too big and the zipper is broken.  Pt is having great difficulty with trying to ambulate this way as roller walkers don't work well with one hand.  Provider attempted to assist pt with keeping his pants up so he could use 2 hands on his walker but he didn't like that and pulled away.  At this point, his ride was able to help him get to his car.               Assessment & Plan:    Encounter Diagnoses  Name Primary?  . Chronic back pain, unspecified  back location, unspecified back pain laterality Yes  . Acute low back pain, unspecified back pain laterality, unspecified whether sciatica present   . Tobacco use disorder   . Poor appetite   . Fall, initial encounter      -Refer to orthopedist in [place] -pt to get xrays today -pt is given application for caf[employer] financial assistance -pt was given rx gabapentin.  He wasn't happy that this office does not prescribe narcotics -pt was encouraged to get covid vaccination -pt to follow up 1 month for abd pain.  Pt to contact office sooner prn  Phone [phone]- marvin's #  (don't call before 11 am)

## 2020-06-18 ENCOUNTER — Encounter: Payer: Self-pay | Admitting: Student

## 2020-06-19 ENCOUNTER — Encounter: Payer: Self-pay | Admitting: Physician Assistant

## 2020-06-20 ENCOUNTER — Other Ambulatory Visit: Payer: Self-pay | Admitting: Physician Assistant

## 2020-06-25 ENCOUNTER — Other Ambulatory Visit: Payer: Self-pay | Admitting: Physician Assistant

## 2020-07-04 ENCOUNTER — Ambulatory Visit: Payer: Self-pay | Admitting: Physician Assistant

## 2020-07-16 ENCOUNTER — Telehealth: Payer: Self-pay

## 2020-07-16 NOTE — Telephone Encounter (Signed)
Attempted to return call to client to [phone]. Person that answered states Mr. Deroy is no longer there.  Attempted call to number listed in [facility] for [facility] [phone] , no answer and unable to leave a voicemail.   [name] RN [employer]

## 2020-07-18 ENCOUNTER — Encounter (HOSPITAL_COMMUNITY): Payer: Self-pay

## 2020-07-18 ENCOUNTER — Emergency Department (HOSPITAL_COMMUNITY)
Admission: EM | Admit: 2020-07-18 | Discharge: 2020-07-18 | Disposition: A | Discharge status: Home / Self Care | Payer: Self-pay | Attending: Emergency Medicine | Admitting: Emergency Medicine

## 2020-07-18 ENCOUNTER — Other Ambulatory Visit: Payer: Self-pay

## 2020-07-18 DIAGNOSIS — R0981 Nasal congestion: Secondary | ICD-10-CM | POA: Insufficient documentation

## 2020-07-18 DIAGNOSIS — Z5321 Procedure and treatment not carried out due to patient leaving prior to being seen by health care provider: Secondary | ICD-10-CM | POA: Insufficient documentation

## 2020-07-18 DIAGNOSIS — R0602 Shortness of breath: Secondary | ICD-10-CM | POA: Insufficient documentation

## 2020-07-18 NOTE — ED Notes (Signed)
Left AMA.

## 2020-07-18 NOTE — ED Triage Notes (Signed)
Pt presents to ED via [facility] for trouble catching his breath x 2 days. Pt states he has allergies and feels like he can't breathe from them.

## 2020-08-10 ENCOUNTER — Ambulatory Visit: Payer: Self-pay | Admitting: Family Medicine

## 2020-09-20 ENCOUNTER — Ambulatory Visit: Payer: Self-pay | Admitting: Physician Assistant

## 2020-09-27 ENCOUNTER — Encounter: Payer: Self-pay | Admitting: Physician Assistant

## 2020-12-12 ENCOUNTER — Telehealth: Payer: Self-pay

## 2020-12-12 NOTE — Telephone Encounter (Signed)
Client called stating he can no longer go to [facility] and wants to know his options. Discussed options of other providers as [facility] and [facility]. Client's care connect application is also needs renewal and discussed with client documents that he is needing. Client states he will bring documents to [place] to complete renewal and he chooses to establish with [facility]. Discussed with client that Health Department will determine copayment based on household income. Client reports understanding and that he will bring his documents in as soon as possible.   [name] RN [employer]

## 2021-02-21 ENCOUNTER — Emergency Department (HOSPITAL_COMMUNITY)
Admission: EM | Admit: 2021-02-21 | Discharge: 2021-02-21 | Disposition: A | Discharge status: Home / Self Care | Payer: Self-pay | Attending: Emergency Medicine | Admitting: Emergency Medicine

## 2021-02-21 ENCOUNTER — Emergency Department (HOSPITAL_COMMUNITY): Payer: Self-pay

## 2021-02-21 ENCOUNTER — Encounter (HOSPITAL_COMMUNITY): Payer: Self-pay | Admitting: Emergency Medicine

## 2021-02-21 ENCOUNTER — Other Ambulatory Visit: Payer: Self-pay

## 2021-02-21 DIAGNOSIS — S0081XA Abrasion of other part of head, initial encounter: Secondary | ICD-10-CM

## 2021-02-21 DIAGNOSIS — S0031XA Abrasion of nose, initial encounter: Secondary | ICD-10-CM | POA: Insufficient documentation

## 2021-02-21 DIAGNOSIS — S0083XA Contusion of other part of head, initial encounter: Secondary | ICD-10-CM

## 2021-02-21 DIAGNOSIS — Z87891 Personal history of nicotine dependence: Secondary | ICD-10-CM | POA: Insufficient documentation

## 2021-02-21 DIAGNOSIS — Y9289 Other specified places as the place of occurrence of the external cause: Secondary | ICD-10-CM | POA: Insufficient documentation
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
## 2021-02-21 NOTE — Discharge Instructions (Addendum)
Local wound care with bacitracin twice daily.  Return to the emergency department if you develop any new and/or concerning symptoms.

## 2021-06-10 ENCOUNTER — Encounter (HOSPITAL_COMMUNITY): Payer: Self-pay

## 2021-06-10 ENCOUNTER — Emergency Department (HOSPITAL_COMMUNITY)
Admission: EM | Admit: 2021-06-10 | Discharge: 2021-06-10 | Disposition: A | Discharge status: Home / Self Care | Payer: Self-pay | Attending: Emergency Medicine | Admitting: Emergency Medicine

## 2021-06-10 ENCOUNTER — Other Ambulatory Visit: Payer: Self-pay

## 2021-06-10 ENCOUNTER — Emergency Department (HOSPITAL_COMMUNITY): Payer: Self-pay

## 2021-06-10 DIAGNOSIS — W208XXA Other cause of strike by thrown, projected or falling object, initial encounter: Secondary | ICD-10-CM | POA: Insufficient documentation

## 2021-06-10 DIAGNOSIS — S92332A Displaced fracture of third metatarsal bone, left foot, initial encounter for closed fracture: Secondary | ICD-10-CM | POA: Insufficient documentation

## 2021-06-10 DIAGNOSIS — S92325A Nondisplaced fracture of second metatarsal bone, left foot, initial encounter for closed fracture: Secondary | ICD-10-CM | POA: Insufficient documentation

## 2021-06-10 DIAGNOSIS — S92342A Displaced fracture of fourth metatarsal bone, left foot, initial encounter for closed fracture: Secondary | ICD-10-CM | POA: Insufficient documentation

## 2021-06-10 NOTE — ED Triage Notes (Signed)
Patient reports left foot pain for the past three weeks after he dropped log on it. States that swelling and pain are worsening. Has not had evaluated prior to today.

## 2021-06-10 NOTE — Discharge Instructions (Signed)
You have broken the second third and fourth metatarsals in your foot, paced you in a cam boot please wear during the day you may take off at nighttime, I would like you to bear weight on your heel try not to bear weight on your midfoot.  Keep it elevated will not use,  ibuprofen Tylenol as needed  Please follow-up with orthopedics for further evaluation  Come back to the emergency department if you develop chest pain, shortness of breath, severe abdominal pain, uncontrolled nausea, vomiting, diarrhea.

## 2021-06-10 NOTE — ED Provider Notes (Signed)
[facility] EMERGENCY DEPARTMENT Provider Note   CSN: [id number] Arrival date & time: 06/10/21  [id number]     History  Chief Complaint  Patient presents with   Foot Pain    [name] is a 66 y.o. male.  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  He states that he has been having pain ever since,has  worsening swelling, denies paresthesia or weakness in his foot, he has no calf tenderness, no chest pain or shortness of breath, he states he has been soaking his foot and taking over-the-counter pain medication without much relief.  He has not seek medical help until now.  He has no other complaints.  Home Medications Prior to Admission medications   Medication Sig Start Date End Date Taking? Authorizing Provider  ARTHROTEC 75-0.2 MG TBEC TAKE 1 Tablet  BY MOUTH TWICE DAILY AS NEEDED 06/26/20   Jacquelin HawkingMcElroy, Shannon, PA-C  cyclobenzaprine (FLEXERIL) 10 MG tablet TAKE 1 Tablet BY MOUTH 3 TIMES DAILY AS NEEDED FOR MUSCLE SPASMS 06/21/20   Jacquelin HawkingMcElroy, Shannon, PA-C  gabapentin (NEURONTIN) 300 MG capsule Take 1 capsule (300 mg total) by mouth every 8 (eight) hours as needed. 06/07/20   Jacquelin HawkingMcElroy, Shannon, PA-C  omeprazole (PRILOSEC) 40 MG capsule TAKE 1 Capsule BY MOUTH ONCE EVERY DAY 06/26/20   Jacquelin HawkingMcElroy, Shannon, PA-C      Allergies    Patient has no known allergies.    Review of Systems   Review of Systems  Constitutional:  Negative for chills and fever.  Respiratory:  Negative for shortness of breath.   Cardiovascular:  Negative for chest pain.  Musculoskeletal:        Foot pain and foot swelling   Physical Exam Updated Vital Signs BP (!) 164/91 (BP Location: Right Arm)    Pulse 74    Temp 98 F (36.7 C) (Oral)    Resp 17    Ht 5\' 8"  (1.727 m)    Wt 68 kg    SpO2 99%    BMI  22.81 kg/m  Physical Exam Vitals and nursing note reviewed.  Constitutional:      General: He is not in acute distress.    Appearance: He is not ill-appearing.  HENT:     Head: Normocephalic and atraumatic.     Nose: No congestion.  Eyes:     Conjunctiva/sclera: Conjunctivae normal.  Cardiovascular:     Rate and Rhythm: Normal rate and regular rhythm.     Pulses: Normal pulses.  Pulmonary:     Effort: Pulmonary effort is normal.  Musculoskeletal:        General: Tenderness present.     Left lower leg: Edema present.     Comments: Left foot was visualized and noted edema around the anterior aspect of the metatarsals, there is no ecchymosis or other skin changes present, no deformities noted, full range of motion in his toes ankle and knee, he was neurovascular intact in the left lower extremity, tenderness to palpation along the fourth and fifth distal the metatarsals, he had no calf tenderness no palpable cords no unilateral leg swelling present.  Skin:    General: Skin is warm and dry.  Neurological:     Mental Status: He is alert.  Psychiatric:  Mood and Affect: Mood normal.    ED Results / Procedures / Treatments   Labs (all labs ordered are listed, but only abnormal results are displayed) Labs Reviewed - No data to display  EKG None  Radiology DG Foot Complete Left  Result Date: 06/10/2021 CLINICAL DATA:  Crush injury after dropping a log on foot. Persistent pain. EXAM: LEFT FOOT - COMPLETE 3+ VIEW COMPARISON:  None. FINDINGS: Short oblique fractures are identified through the necks of the second third metatarsals with approximately 1/2 shaft with of lateral displacement of the distal fragments relative to the more proximal fragments. Cortical disruption noted lateral base of the fourth toe metatarsal raising the question of minimally displaced fracture. Linear lucency through the medial sesamoid bone at the head of the great toe metatarsal is likely related to  bipartite anatomy. Hallux valgus deformity evident. Deformity in the distal fibula is compatible with healing of the fracture seen in the distal fibula on remote ankle study from 01/05/2015. IMPRESSION: 1. Short oblique fractures through the necks of the second and third metatarsals with approximately 1/2 shaft with of displacement. 2. Question minimally displaced fracture at the base of the fourth toe metatarsal. 3. Chronic posttraumatic deformity distal fibula. Electronically Signed   By: Kennith Center M.D.   On: 06/10/2021 10:06    Procedures Procedures    Medications Ordered in ED Medications - No data to display  ED Course/ Medical Decision Making/ A&P                           Medical Decision Making Amount and/or Complexity of Data Reviewed Radiology: ordered.   This patient presents to the ED for concern of Left foot pain, this involves an extensive number of treatment options, and is a complaint that carries with it a high risk of complications and morbidity.  The differential diagnosis includes fracture, dislocation, compartment syndrome, DVT    Additional history obtained:  Additional history obtained from electronic medical record   Co morbidities that complicate the patient evaluation  N/A  Social Determinants of Health:  N/A    Lab Tests:  I Ordered, and personally interpreted labs.  The pertinent results include: N/A   Imaging Studies ordered:  I ordered imaging studies including DG of left foot I independently visualized and interpreted imaging which showed shows fracture through the second and third metatarsals with displacement, and probable fracture of the fourth metatarsal with minimal displacement I agree with the radiologist interpretation    Reevaluation:  Updated patient on imaging, will consult with orthopedics for further evaluation.  Updated the patient with recommendations from orthopedics agreement this plan will place in a cam boot  and he is ready for discharge.  Consultation-spoke with Dr. Dallas Schimke orthopedic surgery recommends patient may cam boot, may weight-bear on his heels, follow-up in his clinic.     Test Considered:  DVT study but will defer as she has no unilateral leg swelling, no calf tenderness, no palpable cords, presentation more consistent with likely trauma to the top of his foot causing swelling.    Rule out I have low suspicion for septic arthritis as patient denies IV drug use, skin exam was performed no erythematous, edematous, warm joints noted on exam.  It is noted the patient has some displacement in his metatarsals low suspicion that it would have to be reduce as there is minimally displaced also happened 3 weeks ago.  low suspicion for ligament or tendon damage  as area was palpated no gross defects noted, they had full range of motion as well as 5/5 strength.  Low suspicion for compartment syndrome as area was palpated it was soft to the touch, neurovascular fully intact.     Dispostion and problem list  After consideration of the diagnostic results and the patients response to treatment, I feel that the patent would benefit from discharge follow-up with orthopedics  Metatarsal fractures-placed in a cam boot, recommend symptom management, follow-up with orthopedics.             Final Clinical Impression(s) / ED Diagnoses Final diagnoses:  Closed nondisplaced fracture of second metatarsal bone of left foot, initial encounter  Closed displaced fracture of third metatarsal bone of left foot, initial encounter  Closed displaced fracture of fourth metatarsal bone of left foot, initial encounter    Rx / DC Orders ED Discharge Orders     None         Carroll Sage, PA-C 06/10/21 1102    Gloris Manchester, MD 06/12/21 [phone]

## 2021-06-10 NOTE — ED Notes (Signed)
Patient noted to be walking in hallway. States that he has somewhere to be at 74. Re-directed back to room, patient laughing.

## 2021-06-19 ENCOUNTER — Ambulatory Visit: Payer: Self-pay | Admitting: Orthopedic Surgery

## 2021-06-25 ENCOUNTER — Ambulatory Visit: Payer: Self-pay | Admitting: Orthopedic Surgery

## 2021-06-25 ENCOUNTER — Encounter: Payer: Self-pay | Admitting: Orthopedic Surgery

## 2021-07-03 ENCOUNTER — Encounter: Payer: Self-pay | Admitting: Orthopedic Surgery

## 2021-07-03 ENCOUNTER — Ambulatory Visit: Payer: Self-pay

## 2021-07-03 ENCOUNTER — Other Ambulatory Visit: Payer: Self-pay

## 2021-07-03 ENCOUNTER — Ambulatory Visit (INDEPENDENT_AMBULATORY_CARE_PROVIDER_SITE_OTHER): Payer: Self-pay | Admitting: Orthopedic Surgery

## 2021-07-03 VITALS — Ht 68.0 in | Wt 142.0 lb

## 2021-07-03 DIAGNOSIS — M79672 Pain in left foot: Secondary | ICD-10-CM

## 2021-07-03 DIAGNOSIS — S92345A Nondisplaced fracture of fourth metatarsal bone, left foot, initial encounter for closed fracture: Secondary | ICD-10-CM

## 2021-07-03 DIAGNOSIS — S92322A Displaced fracture of second metatarsal bone, left foot, initial encounter for closed fracture: Secondary | ICD-10-CM

## 2021-07-03 DIAGNOSIS — S92332A Displaced fracture of third metatarsal bone, left foot, initial encounter for closed fracture: Secondary | ICD-10-CM

## 2021-07-03 MED ORDER — TRAMADOL HCL 50 MG PO TABS: 50.0000 mg | ORAL_TABLET | Freq: Four times a day (QID) | ORAL | 0 refills | Status: DC | PRN

## 2021-07-03 NOTE — Patient Instructions (Signed)
Wear the boot  Ok to bear weight through your heel

## 2021-07-03 NOTE — Progress Notes (Signed)
New Patient Visit  Assessment: [name] is a 66 y.o. male with the following: 1. Closed displaced fracture of second metatarsal bone of left foot, initial encounter 2. Closed displaced fracture of third metatarsal bone of left foot, initial encounter 3. Closed nondisplaced fracture of fourth metatarsal bone of left foot, initial encounter  Plan: Multiple fractures in his left foot.  These were sustained over a month ago.  He has been wearing a walking boot appropriately.  There is evidence of healing on today's x-rays.  As result, I would recommend against operative management.  He is in agreement with this plan.  Continue to wear the boot.  Okay to bear weight through his heel.  Follow-up in 2 weeks.  At that time, anticipate we will be able to advance his weightbearing.   Follow-up: Return in about 2 weeks (around 07/17/2021).  Subjective:  Chief Complaint  Patient presents with   Foot Pain    Lt foot DOI 05/22/21    History of Present Illness: [name] is a 66 y.o. male who presents for evaluation of his left foot.  Approximately 1 month ago, he states a log fell on his foot.  Initially, he thought it was just bruising.  He tried to treat it with over-the-counter pain medications.  He was trying to ice his foot.  After approximately 3-4 weeks, he presented for evaluation.  In the emergency department x-rays demonstrated multiple fractures in his left foot.  He has been wearing a boot on his left foot.  He has been bearing weight through his heel.  He is taking medications as needed   Review of Systems: No fevers or chills No numbness or tingling No chest pain No shortness of breath No bowel or bladder dysfunction No GI distress No headaches   Medical History:  Past Medical History:  Diagnosis Date   Alcohol abuse    Bipolar disorder (HCC)    Chronic back pain    Depression    GERD (gastroesophageal reflux disease)    Hallucinations    pt denies   Homeless     pt says he now lives in his father's farm house   Hx of medication noncompliance    OCD (obsessive compulsive disorder)    Paranoid ideation (HCC)     Past Surgical History:  Procedure Laterality Date   FRACTURE SURGERY  1978   neck    LEG SURGERY Left 2000   NECK SURGERY      Family History  Problem Relation Age of Onset   Kidney failure Mother    Colon cancer Father 62   Heart attack Brother    Hypertension Brother    Kidney failure Brother    Social History   Tobacco Use   Smoking status: Former    Years: 0.50    Types: Cigarettes    Quit date: 09/10/2019    Years since quitting: 1.8   Smokeless tobacco: Never   Tobacco comments:    3 cig/ month  Vaping Use   Vaping Use: Never used  Substance Use Topics   Alcohol use: Yes    Alcohol/week: 48.0 standard drinks    Types: 48 Shots of liquor per week    Comment: pint daily   Drug use: Yes    Types: Marijuana    Comment: "rare"    No Known Allergies  Current Meds  Medication Sig   traMADol (ULTRAM) 50 MG tablet Take 1 tablet (50 mg total) by mouth every  6 (six) hours as needed.    Objective: Ht 5\' 8"  (1.727 m)    Wt 142 lb (64.4 kg)    BMI 21.59 kg/m   Physical Exam:  General: Alert and oriented. and No acute distress. Gait: Left sided antalgic gait.  Evaluation of the left foot demonstrates some diffuse swelling over the dorsum of the foot.  He has a bunion deformity.  Mild tenderness to palpation over the forefoot.  Toes are warm and well-perfused.  Sensations intact over the dorsum of the foot.  Active motion intact in the TA/EHL.  IMAGING: I personally ordered and reviewed the following images  X-ray of the left foot was obtained in clinic today.  There are multiple fractures of multiple metatarsals.  There are distal metatarsal fractures to the second and third, with some displacement.  There is callus formation.  There is a fracture of the base of the fourth metatarsal, which is minimally  displaced.  Impression: Healing left second, third and fourth metatarsal fractures   New Medications:  Meds ordered this encounter  Medications   traMADol (ULTRAM) 50 MG tablet    Sig: Take 1 tablet (50 mg total) by mouth every 6 (six) hours as needed.    Dispense:  20 tablet    Refill:  0      , MD  07/03/2021 11:54 AM

## 2021-07-17 ENCOUNTER — Ambulatory Visit (INDEPENDENT_AMBULATORY_CARE_PROVIDER_SITE_OTHER): Payer: Self-pay | Admitting: Orthopedic Surgery

## 2021-07-17 ENCOUNTER — Other Ambulatory Visit: Payer: Self-pay

## 2021-07-17 ENCOUNTER — Encounter: Payer: Self-pay | Admitting: Orthopedic Surgery

## 2021-07-17 ENCOUNTER — Ambulatory Visit: Payer: Self-pay

## 2021-07-17 DIAGNOSIS — S92332D Displaced fracture of third metatarsal bone, left foot, subsequent encounter for fracture with routine healing: Secondary | ICD-10-CM

## 2021-07-17 DIAGNOSIS — S92322D Displaced fracture of second metatarsal bone, left foot, subsequent encounter for fracture with routine healing: Secondary | ICD-10-CM

## 2021-07-17 DIAGNOSIS — S92345D Nondisplaced fracture of fourth metatarsal bone, left foot, subsequent encounter for fracture with routine healing: Secondary | ICD-10-CM

## 2021-07-17 NOTE — Progress Notes (Signed)
Return patient Visit ? ?Assessment: ?[name] is a 66 y.o. male with the following: ?1. Closed displaced fracture of second metatarsal bone of left foot ?2. Closed displaced fracture of third metatarsal bone of left foot ?3. Closed nondisplaced fracture of fourth metatarsal bone of left foot ? ?Plan: ?[name] sustained his injury approximately 2 months ago.  Radiographs are stable.  He is ambulating in a regular shoe.  Okay to progress his weightbearing as tolerated.  He can gradually increase his level of activity.  Medications as needed.  Follow-up as needed. ? ? ?Follow-up: ?Return if symptoms worsen or fail to improve. ? ?Subjective: ? ?Chief Complaint  ?Patient presents with  ? fracture care  ?  DOI 05/22/21 ?Closed displaced fracture of second metatarsal bone LEFT foot  ? ? ?History of Present Illness: ?[name] is a 66 y.o. male who presents for repeat evaluation of his left foot.  He sustained an injury to his left foot approximately 2 months ago.  He had delayed presentation to the emergency department, and is now 2 months out from sustaining multiple fractures.  He is wearing a regular shoe.  He says he switches back and forth between the walking boot, and is regular shoe.  He notes swelling in his foot, but this improves when he elevates his foot.  He is not taking any medications currently. ? ? ?Review of Systems: ?No fevers or chills ?No numbness or tingling ?No chest pain ?No shortness of breath ?No bowel or bladder dysfunction ?No GI distress ?No headaches ? ? ?Objective: ?There were no vitals taken for this visit. ? ?Physical Exam: ? ?General: Alert and oriented. and No acute distress. ?Gait: Left sided antalgic gait. ? ?Left foot with some swelling over the dorsum of his foot.  His foot is dirty.  He has callus formation on the heel.  Visible dirt between his toes.  No tenderness to palpation over the forefoot.  He has a very noticeable bunion.  Previous injury to the distal fibula.  Toes  are warm and well-perfused.  Sensations intact over the dorsum of his foot. ? ? ?IMAGING: ?I personally ordered and reviewed the following images ? ?Standing x-rays of the left foot were obtained in clinic today.  There is been no interval displacement of the fractures to the second, third and fourth metatarsals.  There is callus formation, particularly around the second and third metatarsals.  Bunion is noted.  Remote injury to the distal fibula. ? ?Impression: Healing left second, third and fourth metatarsal fractures.  Alignment unchanged. ? ?New Medications:  ?No orders of the defined types were placed in this encounter. ? ? ? ? ?Oliver Barre, MD ? ?07/17/2021 ?9:02 AM ? ? ?

## 2021-07-26 ENCOUNTER — Emergency Department (HOSPITAL_COMMUNITY): Admission: EM | Admit: 2021-07-26 | Discharge: 2021-07-26 | Discharge status: Against Medical Advice | Payer: MEDICAID

## 2021-07-26 NOTE — ED Notes (Signed)
This RN walked outside and is unable to locate pt ?

## 2021-07-26 NOTE — ED Notes (Signed)
[facility] lobby empty when attempted to call pt, per registration pt walked outside after registering; staff walked outside and does not see pt  ?

## 2021-09-20 ENCOUNTER — Ambulatory Visit: Payer: Self-pay | Admitting: Orthopedic Surgery

## 2021-10-02 ENCOUNTER — Ambulatory Visit: Payer: Self-pay | Admitting: Orthopedic Surgery

## 2021-10-11 ENCOUNTER — Encounter: Payer: Self-pay | Admitting: Orthopedic Surgery

## 2021-10-11 ENCOUNTER — Other Ambulatory Visit: Payer: Self-pay | Admitting: Orthopedic Surgery

## 2021-10-11 ENCOUNTER — Ambulatory Visit (INDEPENDENT_AMBULATORY_CARE_PROVIDER_SITE_OTHER): Payer: Self-pay

## 2021-10-11 ENCOUNTER — Ambulatory Visit: Payer: Self-pay | Admitting: Orthopedic Surgery

## 2021-10-11 VITALS — BP 158/93 | HR 92 | Ht 68.0 in | Wt 148.0 lb

## 2021-10-11 DIAGNOSIS — M47812 Spondylosis without myelopathy or radiculopathy, cervical region: Secondary | ICD-10-CM

## 2021-10-11 DIAGNOSIS — M542 Cervicalgia: Secondary | ICD-10-CM

## 2021-10-11 DIAGNOSIS — M25511 Pain in right shoulder: Secondary | ICD-10-CM

## 2021-10-11 DIAGNOSIS — M5412 Radiculopathy, cervical region: Secondary | ICD-10-CM
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
## 2021-10-11 NOTE — Patient Instructions (Addendum)
Instructions Following Joint Injections  In clinic today, you received an injection in one of your joints (sometimes more than one).  Occasionally, you can have some pain at the injection site, this is normal.  You can place ice at the injection site, or take over-the-counter medications such as Tylenol (acetaminophen) or Advil (ibuprofen).  Please follow all directions listed on the bottle.  If your joint (knee or shoulder) becomes swollen, red or very painful, please contact the clinic for additional assistance.   Two medications were injected, including lidocaine and a steroid (often referred to as cortisone).  Lidocaine is effective almost immediately but wears off quickly.  However, the steroid can take a few days to improve your symptoms.  In some cases, it can make your pain worse for a couple of days.  Do not be concerned if this happens as it is common.  You can apply ice or take some over-the-counter medications as needed.    While we are working on your approval please go ahead and call to schedule your appointment with [name] in at least 2 weeks.    Central Scheduling [phone]

## 2021-10-17 ENCOUNTER — Ambulatory Visit (HOSPITAL_COMMUNITY)
Admission: RE | Admit: 2021-10-17 | Discharge: 2021-10-17 | Disposition: A | Discharge status: Home / Self Care | Payer: Self-pay | Source: Ambulatory Visit | Attending: Orthopedic Surgery | Admitting: Orthopedic Surgery

## 2021-10-17 DIAGNOSIS — M25511 Pain in right shoulder: Secondary | ICD-10-CM | POA: Insufficient documentation

## 2021-10-17 DIAGNOSIS — M47812 Spondylosis without myelopathy or radiculopathy, cervical region: Secondary | ICD-10-CM | POA: Insufficient documentation

## 2021-10-17 DIAGNOSIS — M5412 Radiculopathy, cervical region: Secondary | ICD-10-CM | POA: Insufficient documentation

## 2021-10-23 ENCOUNTER — Ambulatory Visit (INDEPENDENT_AMBULATORY_CARE_PROVIDER_SITE_OTHER): Payer: Self-pay | Admitting: Orthopedic Surgery

## 2021-10-23 ENCOUNTER — Encounter: Payer: Self-pay | Admitting: Orthopedic Surgery

## 2021-10-23 DIAGNOSIS — M47812 Spondylosis without myelopathy or radiculopathy, cervical region: Secondary | ICD-10-CM

## 2021-10-23 DIAGNOSIS — M5412 Radiculopathy, cervical region: Secondary | ICD-10-CM

## 2021-10-23 NOTE — Progress Notes (Signed)
Orthopaedic Clinic Return  Assessment: [name] is a 66 y.o. male with the following: 1.  Right shoulder pain, weakness 2.  Cervical spine arthritis 3.  Cervical radiculopathy; numbness and tingling both hands   Plan: [name] has signs and symptoms consistent with cervical myelopathy.  We reviewed the MRI in clinic today, which demonstrates severe stenosis at multiple levels.  I think he needs to be evaluated by a spine surgeon, and we will place a referral for him to see Dr. Lorin Mercy, and is within [facility].  If he has issues with establishing an appointment, I have asked him to contact clinic.  If he has further issues with his right shoulder, I am happy to address these, but feel that the cervical spine needs to be addressed first.  Follow-up: Return for Referral to Dr. Lorin Mercy.   Subjective:  Chief Complaint  Patient presents with   Shoulder Pain    Review MRI results    History of Present Illness: [name] is a 66 y.o. male who returns to clinic for evaluation of repeat evaluation of his cervical spine.  I saw him in clinic within the last couple weeks.  We injected his right shoulder.  He states that it helped his pain for a couple of days.  He then tried to do some yard work, and states that his shoulder is bothering him again.  He has also had a cervical spine MRI.  He is here to discuss the results.  He continues to have numbness and tingling in both hands.  He had trauma to his neck many years ago.  No prior surgeries on his neck.   Review of Systems: No fevers or chills + numbness and tingling No chest pain No shortness of breath No bowel or bladder dysfunction No GI distress No headaches   Objective: There were no vitals taken for this visit.  Physical Exam:  Alert and oriented.  No acute distress.  Thin, older male.  He has restricted range of motion of the cervical spine, with some shooting pains into the right shoulder with rotation.  It is more painful when  he rotates his head to the left.  Negative Hoffmann's bilaterally.  With active range of motion, he is using his trapezius muscle on the right.  Decreased sensation to bilateral hands.  Fingers are warm and well-perfused.  IMAGING: I personally ordered and reviewed the following images:  Cervical spine MRI  Discs: Degenerative disease with disc height loss at C3-4, C4-5, C5-6, C6-7 and C7-T1.   C2-3: Mild broad-based disc bulge. Mild bilateral facet arthropathy. Moderate left foraminal stenosis. No right foraminal stenosis. Mild spinal stenosis.   C3-4: Broad-based disc osteophyte complex. Severe bilateral facet arthropathy. Bilateral uncovertebral degenerative changes. Severe spinal stenosis. Severe bilateral foraminal stenosis.   C4-5: Broad-based disc osteophyte complex. Severe right and mild left facet arthropathy. Bilateral uncovertebral degenerative changes. Severe bilateral foraminal stenosis. Moderate spinal stenosis.   C5-6: Mild broad-based disc osteophyte complex. Bilateral uncovertebral degenerative changes. Severe bilateral foraminal stenosis. Mild spinal stenosis.   C6-7: Mild broad-based disc bulge. Bilateral uncovertebral degenerative changes. Moderate right and severe left foraminal stenosis.   C7-T1: Minimal broad-based disc bulge. Moderate bilateral foraminal stenosis. No spinal stenosis.   IMPRESSION: 1. Diffuse cervical spine spondylosis as described above. 2. No acute osseous injury of the cervical spine.    Mordecai Rasmussen, MD 10/23/2021 9:00 AM

## 2021-11-07 ENCOUNTER — Ambulatory Visit: Payer: Self-pay | Admitting: Orthopaedic Surgery

## 2021-11-14 ENCOUNTER — Telehealth: Payer: Self-pay | Admitting: Orthopedic Surgery

## 2021-11-14 NOTE — Telephone Encounter (Signed)
Patient came to office this morning to ask if he may schedule appointment to get another cortisone shot in right shoulder. York Spaniel was not able to see neurosurgeon due to financial reasons. Please advise if too soon or when patient may be considered for another injection. Patient will also be talking with financial counselor who comes to [facility] on Thursdays.

## 2021-11-15 NOTE — Telephone Encounter (Signed)
Called and detailed VM left with call back # for more questions.

## 2021-11-29 ENCOUNTER — Telehealth: Payer: Self-pay | Admitting: Orthopedic Surgery

## 2021-11-29 NOTE — Telephone Encounter (Signed)
Patient came to office this morning to inquire about scheduling appointment for injection in right shoulder. Per Dr Amedeo Kinsman' notes, patient may have injection 3 months after last injection, 10/11/21; however, also noted is referral to neurosurgeon by Dr Amedeo Kinsman. Patient relays he had to cancel the appointment with Dr Lorin Mercy due to not having the self-pay fee requested.  Discussed as per previous notes, the importance of seeing neurosurgeon; patient voiced understanding. Patient states he has met with financial counselor; said has filled out the application, and said has sent letter to finance office in [place]. States was told he should qualify; awaiting response from them.

## 2021-12-04 NOTE — Telephone Encounter (Signed)
Called back to patient upon receiving voice mail - patient asking 'if he can get something done' - said can come as self pay after 12/25/21, since financial assistance is still in process. Okay to schedule?

## 2021-12-06 NOTE — Telephone Encounter (Signed)
Patient returned call - said he will go ahead and reschedule with the neuro office / Dr Ophelia Charter first, as per referral, and per missed appointment; states he will have fee after 12/25/21.

## 2021-12-09 ENCOUNTER — Telehealth: Payer: Self-pay | Admitting: Orthopedic Surgery

## 2021-12-09 NOTE — Telephone Encounter (Signed)
Patient came by office this monring, and said he had a question, however, he was behind other scheduled patients, and by the time I was able to try to assist, he said he couldn't wait any longer. He called back via voice message during time office was closed at lunch, and said he lost the number I had given him the other day for Dr Ophelia Charter. He said on his message that he now has his voice mail set up. I called back to the number left on message, [phone], and received automated response 'no voice mail set up.'

## 2021-12-24 ENCOUNTER — Telehealth: Payer: Self-pay | Admitting: Orthopedic Surgery

## 2021-12-24 NOTE — Telephone Encounter (Signed)
Returned call to patient two times at both numbers on file, was able to leave a message at [phone], with financial counselor contact information.

## 2021-12-25 ENCOUNTER — Emergency Department (HOSPITAL_COMMUNITY)
Admission: EM | Admit: 2021-12-25 | Discharge: 2021-12-25 | Discharge status: Against Medical Advice | Payer: Medicaid Other | Attending: Emergency Medicine | Admitting: Emergency Medicine

## 2021-12-25 ENCOUNTER — Emergency Department (HOSPITAL_COMMUNITY): Admission: EM | Admit: 2021-12-25 | Discharge: 2021-12-25 | Discharge status: Against Medical Advice | Payer: Self-pay

## 2021-12-25 ENCOUNTER — Encounter (HOSPITAL_COMMUNITY): Payer: Self-pay | Admitting: Emergency Medicine

## 2021-12-25 ENCOUNTER — Emergency Department (HOSPITAL_COMMUNITY): Payer: Medicaid Other

## 2021-12-25 ENCOUNTER — Emergency Department (HOSPITAL_COMMUNITY)
Admission: EM | Admit: 2021-12-25 | Discharge: 2021-12-26 | Disposition: A | Discharge status: Home / Self Care | Payer: Medicaid Other | Attending: Emergency Medicine | Admitting: Emergency Medicine

## 2021-12-25 ENCOUNTER — Other Ambulatory Visit: Payer: Self-pay

## 2021-12-25 DIAGNOSIS — W19XXXA Unspecified fall, initial encounter: Secondary | ICD-10-CM | POA: Insufficient documentation

## 2021-12-25 DIAGNOSIS — S20212A Contusion of left front wall of thorax, initial encounter: Secondary | ICD-10-CM | POA: Diagnosis not present

## 2021-12-25 DIAGNOSIS — S299XXA Unspecified injury of thorax, initial encounter: Secondary | ICD-10-CM | POA: Diagnosis present

## 2021-12-25 DIAGNOSIS — F10920 Alcohol use, unspecified with intoxication, uncomplicated: Secondary | ICD-10-CM

## 2021-12-25 DIAGNOSIS — Y92009 Unspecified place in unspecified non-institutional (private) residence as the place of occurrence of the external cause: Secondary | ICD-10-CM | POA: Insufficient documentation

## 2021-12-25 DIAGNOSIS — Z5321 Procedure and treatment not carried out due to patient leaving prior to being seen by health care provider: Secondary | ICD-10-CM | POA: Insufficient documentation

## 2021-12-25 DIAGNOSIS — F10129 Alcohol abuse with intoxication, unspecified: Secondary | ICD-10-CM | POA: Diagnosis not present

## 2021-12-25 DIAGNOSIS — R0781 Pleurodynia: Secondary | ICD-10-CM | POA: Insufficient documentation

## 2021-12-25 MED ORDER — ALBUTEROL SULFATE HFA 108 (90 BASE) MCG/ACT IN AERS: 2.0000 | INHALATION_SPRAY | RESPIRATORY_TRACT | Status: DC | PRN

## 2021-12-25 NOTE — ED Notes (Signed)
Patient refusing to stay in his bed and states he has to go to [place] to go to work. Patient alert & oriented X3. Patient states he does not want to see a doctor and he needs to go. This nurse attempted to redirect patient multiple times without success. Patient ambulated out the emergency room doors.

## 2021-12-25 NOTE — ED Triage Notes (Signed)
Patient brought in by [facility] for a fall.  Patient was picked up on the side of the road at [employer] after falling at home.  Patient states he has left rib pain.  EMS states that patient has been drinking.  Patient is A&O x4, conscious and alert at triage.

## 2021-12-25 NOTE — ED Triage Notes (Signed)
Pt brought in triage room and states he does not want to be seen he is waiting for his boss to come pick him up; this RN verified pt did not want to be seen by EDP and pt agreed; pt taken back out to waiting via wheelchair

## 2021-12-25 NOTE — ED Triage Notes (Signed)
Pt brought in by ems for c/o pain to left side; pt was found in lying in random yard across from hospital;   Pt left AMA from this facility one hour pta

## 2021-12-25 NOTE — ED Triage Notes (Signed)
Pt states he is sob and cannot walk, he appears to be intoxicated by his actions. He states he drinks daily but has not had any today. Pt has been here twice today to be seen but left without being seen.

## 2021-12-26 ENCOUNTER — Ambulatory Visit: Payer: Self-pay | Admitting: Orthopaedic Surgery

## 2021-12-26 LAB — CBC WITH DIFFERENTIAL/PLATELET
Abs Immature Granulocytes: 0 10*3/uL (ref 0.00–0.07)
Basophils Absolute: 0 10*3/uL (ref 0.0–0.1)
Basophils Relative: 1 %
Eosinophils Absolute: 0.3 10*3/uL (ref 0.0–0.5)
Eosinophils Relative: 6 %
HCT: 40 % (ref 39.0–52.0)
Hemoglobin: 13.8 g/dL (ref 13.0–17.0)
Immature Granulocytes: 0 %
Lymphocytes Relative: 22 %
Lymphs Abs: 1.1 10*3/uL (ref 0.7–4.0)
MCH: 34.6 pg — ABNORMAL HIGH (ref 26.0–34.0)
MCHC: 34.5 g/dL (ref 30.0–36.0)
MCV: 100.3 fL — ABNORMAL HIGH (ref 80.0–100.0)
Monocytes Absolute: 0.5 10*3/uL (ref 0.1–1.0)
Monocytes Relative: 11 %
Neutro Abs: 2.8 10*3/uL (ref 1.7–7.7)
Neutrophils Relative %: 60 %
Platelets: 294 10*3/uL (ref 150–400)
RBC: 3.99 MIL/uL — ABNORMAL LOW (ref 4.22–5.81)
RDW: 13 % (ref 11.5–15.5)
WBC: 4.7 10*3/uL (ref 4.0–10.5)
nRBC: 0 % (ref 0.0–0.2)

## 2021-12-26 LAB — ETHANOL: Alcohol, Ethyl (B): 189 mg/dL — ABNORMAL HIGH (ref <10)

## 2021-12-26 LAB — TROPONIN I (HIGH SENSITIVITY)
Troponin I (High Sensitivity): 4 ng/L (ref <18)
Troponin I (High Sensitivity): 4 ng/L (ref <18)

## 2021-12-26 LAB — BASIC METABOLIC PANEL
Anion gap: 8 (ref 5–15)
BUN: 6 mg/dL — ABNORMAL LOW (ref 8–23)
CO2: 27 mmol/L (ref 22–32)
Calcium: 8.7 mg/dL — ABNORMAL LOW (ref 8.9–10.3)
Chloride: 103 mmol/L (ref 98–111)
Creatinine, Ser: 0.83 mg/dL (ref 0.61–1.24)
GFR, Estimated: >60 mL/min (ref >60)
Glucose, Bld: 78 mg/dL (ref 70–99)
Potassium: 3.8 mmol/L (ref 3.5–5.1)
Sodium: 138 mmol/L (ref 135–145)

## 2021-12-26 LAB — BRAIN NATRIURETIC PEPTIDE: B Natriuretic Peptide: 67 pg/mL (ref 0.0–100.0)

## 2021-12-26 NOTE — ED Provider Notes (Signed)
[facility] EMERGENCY DEPARTMENT Provider Note   CSN: [id number] Arrival date & time: 12/25/21  2127     History  Chief Complaint  Patient presents with   Shortness of Breath    [name] is a 66 y.o. male.  Patient presents to the emergency department with complaints of left rib pain.  Patient reports that he is weak and short of breath.  He reports that he fell approximately 3 days ago injuring his left ribs.  He did not hit his head or lose consciousness.       Home Medications Prior to Admission medications   Medication Sig Start Date End Date Taking? Authorizing Provider  ARTHROTEC 75-0.2 MG TBEC TAKE 1 Tablet  BY MOUTH TWICE DAILY AS NEEDED 06/26/20   Jacquelin Hawking, PA-C  cyclobenzaprine (FLEXERIL) 10 MG tablet TAKE 1 Tablet BY MOUTH 3 TIMES DAILY AS NEEDED FOR MUSCLE SPASMS 06/21/20   Jacquelin Hawking, PA-C  gabapentin (NEURONTIN) 300 MG capsule Take 1 capsule (300 mg total) by mouth every 8 (eight) hours as needed. 06/07/20   Jacquelin Hawking, PA-C      Allergies    Patient has no known allergies.    Review of Systems   Review of Systems  Physical Exam Updated Vital Signs BP 110/77   Pulse 87   Temp 97.6 F (36.4 C) (Oral)   Resp 16   Ht 5\' 8"  (1.727 m)   Wt 67.1 kg   SpO2 96%   BMI 22.49 kg/m  Physical Exam Vitals and nursing note reviewed.  Constitutional:      General: He is not in acute distress.    Appearance: He is well-developed.  HENT:     Head: Normocephalic and atraumatic.     Mouth/Throat:     Mouth: Mucous membranes are moist.  Eyes:     General: Vision grossly intact. Gaze aligned appropriately.     Extraocular Movements: Extraocular movements intact.     Conjunctiva/sclera: Conjunctivae normal.  Cardiovascular:     Rate and Rhythm: Normal rate and regular rhythm.     Pulses: Normal pulses.     Heart sounds: Normal heart sounds, S1 normal and S2 normal. No murmur heard.    No friction rub. No gallop.  Pulmonary:     Effort:  Pulmonary effort is normal. No respiratory distress.     Breath sounds: Normal breath sounds.  Chest:     Chest wall: Tenderness present. No deformity, swelling or crepitus.  Abdominal:     Palpations: Abdomen is soft.     Tenderness: There is no abdominal tenderness. There is no guarding or rebound.     Hernia: No hernia is present.  Musculoskeletal:        General: No swelling.     Cervical back: Full passive range of motion without pain, normal range of motion and neck supple. No pain with movement, spinous process tenderness or muscular tenderness. Normal range of motion.     Right lower leg: No edema.     Left lower leg: No edema.  Skin:    General: Skin is warm and dry.     Capillary Refill: Capillary refill takes less than 2 seconds.     Findings: No ecchymosis, erythema, lesion or wound.  Neurological:     Mental Status: He is alert and oriented to person, place, and time.     GCS: GCS eye subscore is 4. GCS verbal subscore is 5. GCS motor subscore is 6.  Cranial Nerves: Cranial nerves 2-12 are intact.     Sensory: Sensation is intact.     Motor: Motor function is intact. No weakness or abnormal muscle tone.     Coordination: Coordination is intact.  Psychiatric:        Mood and Affect: Mood normal.        Speech: Speech normal.        Behavior: Behavior normal.     ED Results / Procedures / Treatments   Labs (all labs ordered are listed, but only abnormal results are displayed) Labs Reviewed  CBC WITH DIFFERENTIAL/PLATELET - Abnormal; Notable for the following components:      Result Value   RBC 3.99 (*)    MCV 100.3 (*)    MCH 34.6 (*)    All other components within normal limits  BASIC METABOLIC PANEL - Abnormal; Notable for the following components:   BUN 6 (*)    Calcium 8.7 (*)    All other components within normal limits  ETHANOL - Abnormal; Notable for the following components:   Alcohol, Ethyl (B) 189 (*)    All other components within normal limits   BRAIN NATRIURETIC PEPTIDE  RAPID URINE DRUG SCREEN, HOSP PERFORMED  URINALYSIS, ROUTINE W REFLEX MICROSCOPIC  TROPONIN I (HIGH SENSITIVITY)  TROPONIN I (HIGH SENSITIVITY)    EKG EKG Interpretation  Date/Time:  Wednesday December 25 2021 21:40:54 EDT Ventricular Rate:  96 PR Interval:  134 QRS Duration: 68 QT Interval:  360 QTC Calculation: 454 R Axis:   78 Text Interpretation: Normal sinus rhythm Normal ECG Confirmed by Gilda Crease [phone]) on 12/26/2021 12:06:20 AM  Radiology DG Chest 2 View  Result Date: 12/25/2021 CLINICAL DATA:  Chest pain and shortness of breath following fall 2 days ago, initial encounter EXAM: CHEST - 2 VIEW COMPARISON:  03/27/2013 FINDINGS: Cardiac shadow is within normal limits. Aortic calcifications are seen. The lungs are clear bilaterally. Nipple shadows are again seen. No acute bony abnormality is noted. IMPRESSION: No active cardiopulmonary disease. Electronically Signed   By: Alcide Clever M.D.   On: 12/25/2021 22:11    Procedures Procedures    Medications Ordered in ED Medications  albuterol (VENTOLIN HFA) 108 (90 Base) MCG/ACT inhaler 2 puff (has no administration in time range)    ED Course/ Medical Decision Making/ A&P                           Medical Decision Making Amount and/or Complexity of Data Reviewed Labs: ordered. Radiology: ordered.  Risk Prescription drug management.   Presents to the emergency department for evaluation of generalized weakness, shortness of breath, left-sided chest pain.  Patient indicates that his chest pain is secondary to falling and injuring the ribs on the left side.  Work-up has been reassuring.  No obvious rib fractures, no pneumothorax or pulmonary contusion.  No evidence of acute coronary syndrome or MI, cardiac evaluation is negative.  He was moderately intoxicated at arrival.  Observed here in the ED and has done well.  Appropriate for discharge.        Final Clinical  Impression(s) / ED Diagnoses Final diagnoses:  Rib contusion, left, initial encounter  Alcoholic intoxication without complication [facility])    Rx / DC Orders ED Discharge Orders     None         Eliabeth Shoff, Canary Brim, MD 12/26/21 0425

## 2022-01-09 ENCOUNTER — Ambulatory Visit: Payer: Self-pay | Admitting: Orthopaedic Surgery

## 2022-02-24 ENCOUNTER — Telehealth: Payer: Self-pay | Admitting: Orthopedic Surgery

## 2022-02-24 NOTE — Telephone Encounter (Signed)
Patient came by office to request appointment for right shoulder with possible injection. Relays he had not kept the appointment with Dr Lorin Mercy per referral. States he does now have insurance: [place] [place] [employer]. Please advise if ok to schedule here since referral had been made. Patient's best call back # [phone]

## 2022-02-24 NOTE — Telephone Encounter (Signed)
Called patient; understands, aware to reach out to Dr Lorin Mercy; gave [facility] office phone number.

## 2022-02-25 ENCOUNTER — Other Ambulatory Visit: Payer: Self-pay

## 2022-02-25 ENCOUNTER — Encounter (HOSPITAL_COMMUNITY): Payer: Self-pay

## 2022-02-25 DIAGNOSIS — R410 Disorientation, unspecified: Secondary | ICD-10-CM | POA: Diagnosis present

## 2022-02-25 DIAGNOSIS — Z5321 Procedure and treatment not carried out due to patient leaving prior to being seen by health care provider: Secondary | ICD-10-CM | POA: Diagnosis not present

## 2022-02-25 DIAGNOSIS — R4182 Altered mental status, unspecified: Secondary | ICD-10-CM | POA: Insufficient documentation

## 2022-02-25 LAB — COMPREHENSIVE METABOLIC PANEL
ALT: 32 U/L (ref 0–44)
AST: 67 U/L — ABNORMAL HIGH (ref 15–41)
Albumin: 3.8 g/dL (ref 3.5–5.0)
Alkaline Phosphatase: 89 U/L (ref 38–126)
Anion gap: 9 (ref 5–15)
BUN: 6 mg/dL — ABNORMAL LOW (ref 8–23)
CO2: 24 mmol/L (ref 22–32)
Calcium: 8.2 mg/dL — ABNORMAL LOW (ref 8.9–10.3)
Chloride: 102 mmol/L (ref 98–111)
Creatinine, Ser: 0.64 mg/dL (ref 0.61–1.24)
GFR, Estimated: >60 mL/min (ref >60)
Glucose, Bld: 84 mg/dL (ref 70–99)
Potassium: 3.5 mmol/L (ref 3.5–5.1)
Sodium: 135 mmol/L (ref 135–145)
Total Bilirubin: 0.3 mg/dL (ref 0.3–1.2)
Total Protein: 8 g/dL (ref 6.5–8.1)

## 2022-02-25 LAB — CBC
HCT: 37.9 % — ABNORMAL LOW (ref 39.0–52.0)
Hemoglobin: 13 g/dL (ref 13.0–17.0)
MCH: 34.2 pg — ABNORMAL HIGH (ref 26.0–34.0)
MCHC: 34.3 g/dL (ref 30.0–36.0)
MCV: 99.7 fL (ref 80.0–100.0)
Platelets: 223 10*3/uL (ref 150–400)
RBC: 3.8 MIL/uL — ABNORMAL LOW (ref 4.22–5.81)
RDW: 13.6 % (ref 11.5–15.5)
WBC: 3.5 10*3/uL — ABNORMAL LOW (ref 4.0–10.5)
nRBC: 0 % (ref 0.0–0.2)

## 2022-02-25 NOTE — ED Triage Notes (Signed)
Pt states "I'm just disoriented." Pt states he can not tell what time of day it is. If its day or night. Pt reports drinking a pint of alcohol today.

## 2022-02-26 ENCOUNTER — Emergency Department (HOSPITAL_COMMUNITY)
Admission: EM | Admit: 2022-02-26 | Discharge: 2022-02-26 | Discharge status: Against Medical Advice | Payer: Medicaid Other | Attending: Emergency Medicine | Admitting: Emergency Medicine

## 2022-05-05 ENCOUNTER — Emergency Department (HOSPITAL_COMMUNITY)
Admission: EM | Admit: 2022-05-05 | Discharge: 2022-05-06 | Disposition: A | Discharge status: Home / Self Care | Payer: Medicaid Other | Attending: Emergency Medicine | Admitting: Emergency Medicine

## 2022-05-05 ENCOUNTER — Encounter (HOSPITAL_COMMUNITY): Payer: Self-pay

## 2022-05-05 ENCOUNTER — Other Ambulatory Visit: Payer: Self-pay

## 2022-05-05 DIAGNOSIS — F10130 Alcohol abuse with withdrawal, uncomplicated: Secondary | ICD-10-CM | POA: Diagnosis not present

## 2022-05-05 DIAGNOSIS — Y900 Blood alcohol level of less than 20 mg/100 ml: Secondary | ICD-10-CM | POA: Insufficient documentation

## 2022-05-05 DIAGNOSIS — F10930 Alcohol use, unspecified with withdrawal, uncomplicated: Secondary | ICD-10-CM

## 2022-05-05 DIAGNOSIS — R112 Nausea with vomiting, unspecified: Secondary | ICD-10-CM | POA: Diagnosis present

## 2022-05-05 DIAGNOSIS — Z1152 Encounter for screening for COVID-19: Secondary | ICD-10-CM | POA: Diagnosis not present

## 2022-05-05 LAB — CBC
HCT: 37.5 % — ABNORMAL LOW (ref 39.0–52.0)
Hemoglobin: 12.9 g/dL — ABNORMAL LOW (ref 13.0–17.0)
MCH: 34.3 pg — ABNORMAL HIGH (ref 26.0–34.0)
MCHC: 34.4 g/dL (ref 30.0–36.0)
MCV: 99.7 fL (ref 80.0–100.0)
Platelets: 186 10*3/uL (ref 150–400)
RBC: 3.76 MIL/uL — ABNORMAL LOW (ref 4.22–5.81)
RDW: 14.5 % (ref 11.5–15.5)
WBC: 4.4 10*3/uL (ref 4.0–10.5)
nRBC: 0 % (ref 0.0–0.2)

## 2022-05-05 NOTE — ED Triage Notes (Signed)
Pt presents with emesis, tremors, and visual and auditory hallucinations that started today. Pt states he drinks between a pint and a fifth of brandy each day and his last drink was 2300 last night. Denies SI/HI, however family indicating that pt has had SI.

## 2022-05-06 LAB — COMPREHENSIVE METABOLIC PANEL
ALT: 42 U/L (ref 0–44)
AST: 110 U/L — ABNORMAL HIGH (ref 15–41)
Albumin: 4.5 g/dL (ref 3.5–5.0)
Alkaline Phosphatase: 98 U/L (ref 38–126)
Anion gap: 12 (ref 5–15)
BUN: 12 mg/dL (ref 8–23)
CO2: 28 mmol/L (ref 22–32)
Calcium: 9.6 mg/dL (ref 8.9–10.3)
Chloride: 97 mmol/L — ABNORMAL LOW (ref 98–111)
Creatinine, Ser: 0.75 mg/dL (ref 0.61–1.24)
GFR, Estimated: >60 mL/min (ref >60)
Glucose, Bld: 108 mg/dL — ABNORMAL HIGH (ref 70–99)
Potassium: 3.6 mmol/L (ref 3.5–5.1)
Sodium: 137 mmol/L (ref 135–145)
Total Bilirubin: 1.1 mg/dL (ref 0.3–1.2)
Total Protein: 9.2 g/dL — ABNORMAL HIGH (ref 6.5–8.1)

## 2022-05-06 LAB — URINALYSIS, ROUTINE W REFLEX MICROSCOPIC
Bacteria, UA: NONE SEEN
Bilirubin Urine: NEGATIVE
Glucose, UA: NEGATIVE mg/dL
Hgb urine dipstick: NEGATIVE
Ketones, ur: 20 mg/dL — AB
Leukocytes,Ua: NEGATIVE
Nitrite: NEGATIVE
Protein, ur: 100 mg/dL — AB
Specific Gravity, Urine: 1.019 (ref 1.005–1.030)
pH: 8 (ref 5.0–8.0)

## 2022-05-06 LAB — RAPID URINE DRUG SCREEN, HOSP PERFORMED
Amphetamines: NOT DETECTED
Barbiturates: NOT DETECTED
Benzodiazepines: NOT DETECTED
Cocaine: NOT DETECTED
Opiates: NOT DETECTED
Tetrahydrocannabinol: NOT DETECTED

## 2022-05-06 LAB — RESP PANEL BY RT-PCR (RSV, FLU A&B, COVID)  RVPGX2
Influenza A by PCR: NEGATIVE
Influenza B by PCR: NEGATIVE
Resp Syncytial Virus by PCR: NEGATIVE
SARS Coronavirus 2 by RT PCR: NEGATIVE

## 2022-05-06 LAB — ETHANOL: Alcohol, Ethyl (B): <10 mg/dL (ref <10)

## 2022-05-06 MED ORDER — THIAMINE HCL 100 MG/ML IJ SOLN: 100.0000 mg | Freq: Every day | INTRAMUSCULAR | Status: DC

## 2022-05-06 MED ORDER — LORAZEPAM 2 MG/ML IJ SOLN: 0.0000 mg | Freq: Four times a day (QID) | INTRAMUSCULAR | Status: DC

## 2022-05-06 MED ORDER — THIAMINE MONONITRATE 100 MG PO TABS: 100.0000 mg | ORAL_TABLET | Freq: Every day | ORAL | Status: DC

## 2022-05-06 MED ORDER — ONDANSETRON HCL 4 MG/2ML IJ SOLN
4.0000 mg | Freq: Once | INTRAMUSCULAR | Status: AC
Start: 2022-05-06 — End: 2022-05-06
  Administered 2022-05-06: 4 mg via INTRAVENOUS
  Filled 2022-05-06: qty 2

## 2022-05-06 MED ORDER — LORAZEPAM 2 MG/ML IJ SOLN: 0.0000 mg | Freq: Two times a day (BID) | INTRAMUSCULAR | Status: DC

## 2022-05-06 MED ORDER — SODIUM CHLORIDE 0.9 % IV BOLUS
1000.0000 mL | Freq: Once | INTRAVENOUS | Status: AC
Start: 2022-05-06 — End: 2022-05-06
  Administered 2022-05-06: 1000 mL via INTRAVENOUS

## 2022-05-06 MED ORDER — LORAZEPAM 1 MG PO TABS: 0.0000 mg | ORAL_TABLET | Freq: Two times a day (BID) | ORAL | Status: DC

## 2022-05-06 MED ORDER — LORAZEPAM 1 MG PO TABS: 0.0000 mg | ORAL_TABLET | Freq: Four times a day (QID) | ORAL | Status: DC

## 2022-05-06 MED ORDER — LORAZEPAM 2 MG/ML IJ SOLN
1.0000 mg | Freq: Once | INTRAMUSCULAR | Status: AC
  Administered 2022-05-06: 1 mg via INTRAVENOUS
  Filled 2022-05-06: qty 1
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
## 2022-06-05 ENCOUNTER — Emergency Department (HOSPITAL_COMMUNITY): Payer: Medicaid Other

## 2022-06-05 ENCOUNTER — Emergency Department (HOSPITAL_COMMUNITY)
Admission: EM | Admit: 2022-06-05 | Discharge: 2022-06-05 | Discharge status: Against Medical Advice | Payer: Medicaid Other | Attending: Emergency Medicine | Admitting: Emergency Medicine

## 2022-06-05 ENCOUNTER — Other Ambulatory Visit: Payer: Self-pay

## 2022-06-05 ENCOUNTER — Encounter (HOSPITAL_COMMUNITY): Payer: Self-pay | Admitting: *Deleted

## 2022-06-05 DIAGNOSIS — S8992XA Unspecified injury of left lower leg, initial encounter: Secondary | ICD-10-CM | POA: Diagnosis present

## 2022-06-05 DIAGNOSIS — Z5329 Procedure and treatment not carried out because of patient's decision for other reasons: Secondary | ICD-10-CM | POA: Insufficient documentation

## 2022-06-05 DIAGNOSIS — S82125A Nondisplaced fracture of lateral condyle of left tibia, initial encounter for closed fracture: Secondary | ICD-10-CM | POA: Insufficient documentation

## 2022-06-05 DIAGNOSIS — W010XXA Fall on same level from slipping, tripping and stumbling without subsequent striking against object, initial encounter: Secondary | ICD-10-CM | POA: Diagnosis not present

## 2022-06-05 DIAGNOSIS — S82122A Displaced fracture of lateral condyle of left tibia, initial encounter for closed fracture: Secondary | ICD-10-CM

## 2022-06-05 MED ORDER — NAPROXEN 500 MG PO TABS: 500.0000 mg | ORAL_TABLET | Freq: Two times a day (BID) | ORAL | 0 refills | Status: AC

## 2022-06-05 MED ORDER — NAPROXEN 250 MG PO TABS
500.0000 mg | ORAL_TABLET | Freq: Once | ORAL | Status: AC
  Administered 2022-06-05: 500 mg via ORAL
  Filled 2022-06-05: qty 2

## 2022-06-05 NOTE — Discharge Instructions (Signed)
Please see the phone number for Dr. Aline Brochure above.  You will need to call the office to make an appointment.  Use the crutches and the knee immobilizer until you follow-up.

## 2022-06-05 NOTE — ED Notes (Signed)
Pt signed ama paperwork and left.  Attempted to bring AVS to pt but pt not found.  Pt left ambulatory, carrying crutches
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
## 2022-06-05 NOTE — ED Notes (Signed)
Pt was leaving and advised that he needed to go as he had to go to work and his check needed to be picked up.  Pt ambulatory and carrying crutches.  EDP notified.

## 2022-06-05 NOTE — ED Triage Notes (Signed)
Left knee pain and swelling since fall 2 weeks ago.

## 2022-07-17 ENCOUNTER — Emergency Department (HOSPITAL_COMMUNITY)
Admission: EM | Admit: 2022-07-17 | Discharge: 2022-07-17 | Discharge status: Against Medical Advice | Payer: Medicaid Other | Attending: Emergency Medicine | Admitting: Emergency Medicine

## 2022-07-17 ENCOUNTER — Encounter (HOSPITAL_COMMUNITY): Payer: Self-pay | Admitting: Emergency Medicine

## 2022-07-17 ENCOUNTER — Other Ambulatory Visit: Payer: Self-pay

## 2022-07-17 DIAGNOSIS — Z5321 Procedure and treatment not carried out due to patient leaving prior to being seen by health care provider: Secondary | ICD-10-CM | POA: Diagnosis not present

## 2022-07-17 DIAGNOSIS — R079 Chest pain, unspecified: Secondary | ICD-10-CM

## 2022-07-17 DIAGNOSIS — R0789 Other chest pain: Secondary | ICD-10-CM | POA: Diagnosis present
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
## 2022-07-17 NOTE — ED Notes (Signed)
Patient left without being seen.

## 2022-07-31 ENCOUNTER — Encounter (HOSPITAL_COMMUNITY): Payer: Self-pay | Admitting: Emergency Medicine

## 2022-07-31 ENCOUNTER — Emergency Department (HOSPITAL_COMMUNITY)
Admission: EM | Admit: 2022-07-31 | Discharge: 2022-07-31 | Disposition: A | Discharge status: Home / Self Care | Payer: Medicaid Other | Attending: Emergency Medicine | Admitting: Emergency Medicine

## 2022-07-31 ENCOUNTER — Other Ambulatory Visit: Payer: Self-pay

## 2022-07-31 ENCOUNTER — Emergency Department (HOSPITAL_COMMUNITY): Payer: Medicaid Other

## 2022-07-31 DIAGNOSIS — Z7982 Long term (current) use of aspirin: Secondary | ICD-10-CM | POA: Diagnosis not present

## 2022-07-31 DIAGNOSIS — R0789 Other chest pain: Secondary | ICD-10-CM | POA: Insufficient documentation

## 2022-07-31 DIAGNOSIS — Y908 Blood alcohol level of 240 mg/100 ml or more: Secondary | ICD-10-CM | POA: Diagnosis not present

## 2022-07-31 DIAGNOSIS — F10129 Alcohol abuse with intoxication, unspecified: Secondary | ICD-10-CM | POA: Diagnosis not present

## 2022-07-31 DIAGNOSIS — D72829 Elevated white blood cell count, unspecified: Secondary | ICD-10-CM | POA: Insufficient documentation

## 2022-07-31 DIAGNOSIS — G20C Parkinsonism, unspecified: Secondary | ICD-10-CM | POA: Insufficient documentation

## 2022-07-31 DIAGNOSIS — F10920 Alcohol use, unspecified with intoxication, uncomplicated: Secondary | ICD-10-CM

## 2022-07-31 DIAGNOSIS — R079 Chest pain, unspecified: Secondary | ICD-10-CM

## 2022-07-31 LAB — BASIC METABOLIC PANEL
Anion gap: 10 (ref 5–15)
BUN: 8 mg/dL (ref 8–23)
CO2: 23 mmol/L (ref 22–32)
Calcium: 7.9 mg/dL — ABNORMAL LOW (ref 8.9–10.3)
Chloride: 106 mmol/L (ref 98–111)
Creatinine, Ser: 0.82 mg/dL (ref 0.61–1.24)
GFR, Estimated: >60 mL/min (ref >60)
Glucose, Bld: 98 mg/dL (ref 70–99)
Potassium: 3.4 mmol/L — ABNORMAL LOW (ref 3.5–5.1)
Sodium: 139 mmol/L (ref 135–145)

## 2022-07-31 LAB — CBC
HCT: 33.9 % — ABNORMAL LOW (ref 39.0–52.0)
Hemoglobin: 11.5 g/dL — ABNORMAL LOW (ref 13.0–17.0)
MCH: 34.5 pg — ABNORMAL HIGH (ref 26.0–34.0)
MCHC: 33.9 g/dL (ref 30.0–36.0)
MCV: 101.8 fL — ABNORMAL HIGH (ref 80.0–100.0)
Platelets: 183 10*3/uL (ref 150–400)
RBC: 3.33 MIL/uL — ABNORMAL LOW (ref 4.22–5.81)
RDW: 14.1 % (ref 11.5–15.5)
WBC: 3.4 10*3/uL — ABNORMAL LOW (ref 4.0–10.5)
nRBC: 0 % (ref 0.0–0.2)

## 2022-07-31 LAB — TROPONIN I (HIGH SENSITIVITY)
Troponin I (High Sensitivity): 3 ng/L (ref <18)
Troponin I (High Sensitivity): 3 ng/L (ref <18)

## 2022-07-31 LAB — ETHANOL: Alcohol, Ethyl (B): 345 mg/dL

## 2022-07-31 MED ORDER — ASPIRIN 81 MG PO CHEW
324.0000 mg | CHEWABLE_TABLET | Freq: Once | ORAL | Status: AC
  Administered 2022-07-31: 324 mg via ORAL
  Filled 2022-07-31: qty 4

## 2022-07-31 MED ORDER — FAMOTIDINE IN NACL 20-0.9 MG/50ML-% IV SOLN
20.0000 mg | Freq: Once | INTRAVENOUS | Status: AC
  Administered 2022-07-31: 20 mg via INTRAVENOUS
  Filled 2022-07-31: qty 50

## 2022-07-31 MED ORDER — FAMOTIDINE 20 MG PO TABS: 20.0000 mg | ORAL_TABLET | Freq: Two times a day (BID) | ORAL | 0 refills | Status: AC

## 2022-07-31 NOTE — ED Provider Notes (Signed)
[facility] Provider Note   CSN: [id number] Arrival date & time: 07/31/22  1746     History  Chief Complaint  Patient presents with   Chest Pain    [name] is a 67 y.o. male.  He is brought in by EMS for evaluation of chest pain.  He said he has had some left-sided chest pain that is been going on for few days.  He thinks it might be acid reflux but did not respond to Rolaids and it worried him.  He does endorse daily alcohol use, denies tobacco.  Does use infrequent ibuprofen.  He says he falls a lot due to his Parkinson's disease.  Does not recall if he struck his left chest recently.  Has tried nothing for his symptoms.  No prior history of cardiac disease.  The history is provided by the patient and the EMS personnel.  Chest Pain Pain location:  L chest Pain quality: aching   Pain radiates to:  Does not radiate Pain severity:  Moderate Onset quality:  Gradual Duration:  4 days Timing:  Constant Progression:  Unchanged Chronicity:  New Relieved by:  Nothing Worsened by:  Nothing Ineffective treatments:  Antacids Associated symptoms: no abdominal pain, no cough, no fever, no nausea, no shortness of breath and no vomiting   Risk factors: no smoking        Home Medications Prior to Admission medications   Medication Sig Start Date End Date Taking? Authorizing Provider  ARTHROTEC 75-0.2 MG TBEC TAKE 1 Tablet  BY MOUTH TWICE DAILY AS NEEDED Patient not taking: Reported on 06/05/2022 06/26/20   Soyla Dryer, PA-C  cyclobenzaprine (FLEXERIL) 10 MG tablet TAKE 1 Tablet BY MOUTH 3 TIMES DAILY AS NEEDED FOR MUSCLE SPASMS Patient not taking: Reported on 06/05/2022 06/21/20   Soyla Dryer, PA-C  gabapentin (NEURONTIN) 300 MG capsule Take 1 capsule (300 mg total) by mouth every 8 (eight) hours as needed. Patient not taking: Reported on 06/05/2022 06/07/20   Soyla Dryer, PA-C  naproxen (NAPROSYN) 500 MG tablet Take 1 tablet  (500 mg total) by mouth 2 (two) times daily with a meal. 06/05/22   Noemi Chapel, MD      Allergies    Bee pollen    Review of Systems   Review of Systems  Constitutional:  Negative for fever.  Respiratory:  Negative for cough and shortness of breath.   Cardiovascular:  Positive for chest pain.  Gastrointestinal:  Negative for abdominal pain, nausea and vomiting.    Physical Exam Updated Vital Signs BP 110/63   Pulse 83   Temp 97.9 F (36.6 C)   Resp 13   SpO2 95%  Physical Exam Vitals and nursing note reviewed.  Constitutional:      General: He is not in acute distress.    Appearance: He is well-developed.  HENT:     Head: Normocephalic and atraumatic.  Eyes:     Conjunctiva/sclera: Conjunctivae normal.  Cardiovascular:     Rate and Rhythm: Normal rate and regular rhythm.     Heart sounds: Normal heart sounds. No murmur heard. Pulmonary:     Effort: Pulmonary effort is normal. No respiratory distress.     Breath sounds: Normal breath sounds.  Abdominal:     Palpations: Abdomen is soft.     Tenderness: There is no abdominal tenderness.  Musculoskeletal:        General: No swelling. Normal range of motion.     Cervical  back: Neck supple.     Right lower leg: No tenderness. No edema.     Left lower leg: No tenderness. No edema.  Skin:    General: Skin is warm and dry.     Capillary Refill: Capillary refill takes less than 2 seconds.  Neurological:     General: No focal deficit present.     Mental Status: He is alert.     ED Results / Procedures / Treatments   Labs (all labs ordered are listed, but only abnormal results are displayed) Labs Reviewed  BASIC METABOLIC PANEL - Abnormal; Notable for the following components:      Result Value   Potassium 3.4 (*)    Calcium 7.9 (*)    All other components within normal limits  CBC - Abnormal; Notable for the following components:   WBC 3.4 (*)    RBC 3.33 (*)    Hemoglobin 11.5 (*)    HCT 33.9 (*)    MCV  101.8 (*)    MCH 34.5 (*)    All other components within normal limits  ETHANOL - Abnormal; Notable for the following components:   Alcohol, Ethyl (B) 345 (*)    All other components within normal limits  TROPONIN I (HIGH SENSITIVITY)  TROPONIN I (HIGH SENSITIVITY)    EKG EKG Interpretation  Date/Time:  Thursday July 31 2022 17:57:35 EDT Ventricular Rate:  75 PR Interval:  146 QRS Duration: 92 QT Interval:  403 QTC Calculation: 451 R Axis:   54 Text Interpretation: Sinus rhythm COPY Confirmed by Aletta Edouard [phone]) on 07/31/2022 6:09:16 PM  Radiology DG Chest Port 1 View  Result Date: 07/31/2022 CLINICAL DATA:  Chest pain. EXAM: PORTABLE CHEST 1 VIEW COMPARISON:  Chest radiograph dated 12/25/2021. FINDINGS: No focal consolidation, pleural effusion, or pneumothorax. Probable nipple shadow over the left mid lung field. The cardiac silhouette is within normal limits. No acute osseous pathology. IMPRESSION: No active disease. Electronically Signed   By: Anner Crete M.D.   On: 07/31/2022 18:43    Procedures Procedures    Medications Ordered in ED Medications  aspirin chewable tablet 324 mg (324 mg Oral Given 07/31/22 1821)  famotidine (PEPCID) IVPB 20 mg premix (0 mg Intravenous Stopped 07/31/22 1920)    ED Course/ Medical Decision Making/ A&P Clinical Course as of 08/01/22 1424  Thu Jul 31, 2022  1847 Chest x-ray interpreted by me as no pneumothorax no gross infiltrates.  Awaiting radiology reading. [MB]  1848 Alcohol markedly elevated at 345.  Patient is sober appearing so likely lives at a very high alcohol level. [MB]    Clinical Course User Index [MB] Hayden Rasmussen, MD                             Medical Decision Making Amount and/or Complexity of Data Reviewed Labs: ordered. Radiology: ordered.  Risk OTC drugs. Prescription drug management.   This patient complains of chest pain for the last few days; this involves an extensive number of  treatment Options and is a complaint that carries with it a high risk of complications and morbidity. The differential includes ACS, peptic ulcer disease, GERD, pneumothorax, gastritis, esophagitis, GI bleed  I ordered, reviewed and interpreted labs, which included CBC with a low white count low hemoglobin, slightly down from priors, chemistries with mildly low potassium normal renal function, troponins flat, alcohol level elevated at 345 I ordered medication oral aspirin IV Pepcid and reviewed  PMP when indicated. I ordered imaging studies which included chest x-ray and I independently    visualized and interpreted imaging which showed no acute findings Additional history obtained from EMS Previous records obtained and reviewed in epic, patient was here few weeks ago and left without being seen for chest pain  Cardiac monitoring reviewed, normal sinus rhythm Social determinants considered, alcohol abuse Critical Interventions: None  After the interventions stated above, I reevaluated the patient and found patient's pain to be improved and patient resting comfortably Admission and further testing considered, patient states he feels completely resolved and is demanded to be discharged.  I was trying to get him to call somebody so he had a safe ride home but patient states he would rather walk home.  Unable to be convinced otherwise         Final Clinical Impression(s) / ED Diagnoses Final diagnoses:  Nonspecific chest pain  Alcoholic intoxication without complication [facility])    Rx / DC Orders ED Discharge Orders     None         Hayden Rasmussen, MD 08/01/22 1427

## 2022-07-31 NOTE — ED Notes (Signed)
Went over [employer] with pt. Verbalized understanding. Ambulatory to lobby.

## 2022-07-31 NOTE — Discharge Instructions (Signed)
You are seen in the emergency department for chest pain.  There was no evidence of heart attack or pneumonia.  We are prescribing some acid medication.  You should consider stopping drinking as this may be causing you medical issues.  Return to the emergency department if any concerns

## 2022-07-31 NOTE — ED Notes (Signed)
Called all of patient contacts; no one wants to come pick patient up. Patient states he can walk if need be that his friend does not lle too far from here. Advised that is not preferred r/t ETOH.

## 2022-07-31 NOTE — ED Triage Notes (Signed)
Pt bib ems from home with chest pain, worse with inhalation and movement onset yesterday morning. 12 lead unremarkable. VSS stable. ETOH on board.

## 2022-08-02 ENCOUNTER — Emergency Department (HOSPITAL_COMMUNITY)
Admission: EM | Admit: 2022-08-02 | Discharge: 2022-08-02 | Discharge status: Against Medical Advice | Payer: Medicaid Other

## 2022-08-25 ENCOUNTER — Ambulatory Visit: Payer: Medicare Other | Admitting: Podiatry

## 2022-09-22 ENCOUNTER — Ambulatory Visit: Payer: Medicare Other | Admitting: Podiatry
